# Patient Record
Sex: Female | Born: 1980
Health system: Southern US, Community
[De-identification: ages and names within clinical notes are randomized; demographics above are authoritative.]

## PROBLEM LIST (undated history)

## (undated) DIAGNOSIS — K859 Acute pancreatitis without necrosis or infection, unspecified: Secondary | ICD-10-CM

---

## 2003-09-17 HISTORY — PX: ABDOMINAL HERNIA REPAIR: SHX539

## 2003-09-17 HISTORY — PX: HERNIA REPAIR: SHX51

## 2014-11-30 ENCOUNTER — Inpatient Hospital Stay (HOSPITAL_COMMUNITY)
Admission: EM | Admit: 2014-11-30 | Discharge: 2014-12-03 | DRG: 419 | Disposition: A | Discharge status: Home / Self Care | Payer: 59 | Attending: Family Medicine | Admitting: Family Medicine

## 2014-11-30 ENCOUNTER — Encounter (HOSPITAL_COMMUNITY): Payer: Self-pay | Admitting: Family Medicine

## 2014-11-30 ENCOUNTER — Emergency Department (HOSPITAL_COMMUNITY): Payer: 59

## 2014-11-30 DIAGNOSIS — K859 Acute pancreatitis without necrosis or infection, unspecified: Secondary | ICD-10-CM

## 2014-11-30 DIAGNOSIS — K838 Other specified diseases of biliary tract: Secondary | ICD-10-CM

## 2014-11-30 DIAGNOSIS — K8020 Calculus of gallbladder without cholecystitis without obstruction: Secondary | ICD-10-CM

## 2014-11-30 DIAGNOSIS — R945 Abnormal results of liver function studies: Secondary | ICD-10-CM | POA: Diagnosis present

## 2014-11-30 DIAGNOSIS — K851 Biliary acute pancreatitis: Principal | ICD-10-CM | POA: Diagnosis present

## 2014-11-30 DIAGNOSIS — K5900 Constipation, unspecified: Secondary | ICD-10-CM | POA: Diagnosis present

## 2014-11-30 DIAGNOSIS — R112 Nausea with vomiting, unspecified: Secondary | ICD-10-CM

## 2014-11-30 DIAGNOSIS — R109 Unspecified abdominal pain: Secondary | ICD-10-CM | POA: Diagnosis present

## 2014-11-30 HISTORY — DX: Acute pancreatitis without necrosis or infection, unspecified: K85.90

## 2014-11-30 LAB — HEPATIC FUNCTION PANEL
ALT: 591 U/L — AB (ref 0–35)
AST: 913 U/L — ABNORMAL HIGH (ref 0–37)
Albumin: 4 g/dL (ref 3.5–5.2)
Alkaline Phosphatase: 128 U/L — ABNORMAL HIGH (ref 39–117)
BILIRUBIN DIRECT: 0.5 mg/dL (ref 0.0–0.5)
BILIRUBIN INDIRECT: 0.5 mg/dL (ref 0.3–0.9)
TOTAL PROTEIN: 7.6 g/dL (ref 6.0–8.3)
Total Bilirubin: 1 mg/dL (ref 0.3–1.2)

## 2014-11-30 LAB — CBC WITH DIFFERENTIAL/PLATELET
Basophils Absolute: 0 10*3/uL (ref 0.0–0.1)
Basophils Relative: 0 % (ref 0–1)
EOS ABS: 0.1 10*3/uL (ref 0.0–0.7)
EOS PCT: 1 % (ref 0–5)
HEMATOCRIT: 38 % (ref 36.0–46.0)
HEMOGLOBIN: 12.3 g/dL (ref 12.0–15.0)
LYMPHS PCT: 20 % (ref 12–46)
Lymphs Abs: 1.6 10*3/uL (ref 0.7–4.0)
MCH: 27.6 pg (ref 26.0–34.0)
MCHC: 32.4 g/dL (ref 30.0–36.0)
MCV: 85.2 fL (ref 78.0–100.0)
MONOS PCT: 8 % (ref 3–12)
Monocytes Absolute: 0.6 10*3/uL (ref 0.1–1.0)
Neutro Abs: 5.4 10*3/uL (ref 1.7–7.7)
Neutrophils Relative %: 71 % (ref 43–77)
Platelets: 294 10*3/uL (ref 150–400)
RBC: 4.46 MIL/uL (ref 3.87–5.11)
RDW: 13.4 % (ref 11.5–15.5)
WBC: 7.6 10*3/uL (ref 4.0–10.5)

## 2014-11-30 LAB — POC URINE PREG, ED: PREG TEST UR: NEGATIVE

## 2014-11-30 LAB — I-STAT CHEM 8, ED
BUN: 4 mg/dL — ABNORMAL LOW (ref 6–23)
CALCIUM ION: 1.18 mmol/L (ref 1.12–1.23)
Chloride: 102 mmol/L (ref 96–112)
Creatinine, Ser: 0.8 mg/dL (ref 0.50–1.10)
Glucose, Bld: 123 mg/dL — ABNORMAL HIGH (ref 70–99)
HEMATOCRIT: 44 % (ref 36.0–46.0)
Hemoglobin: 15 g/dL (ref 12.0–15.0)
Potassium: 4.6 mmol/L (ref 3.5–5.1)
Sodium: 139 mmol/L (ref 135–145)
TCO2: 22 mmol/L (ref 0–100)

## 2014-11-30 LAB — LIPASE, BLOOD: LIPASE: 338 U/L — AB (ref 11–59)

## 2014-11-30 MED ORDER — POLYETHYLENE GLYCOL 3350 17 G PO PACK: 17.0000 g | PACK | Freq: Every day | ORAL | Status: DC | PRN

## 2014-11-30 MED ORDER — KETOROLAC TROMETHAMINE 30 MG/ML IJ SOLN
30.0000 mg | Freq: Four times a day (QID) | INTRAMUSCULAR | Status: DC | PRN
  Administered 2014-12-02: 30 mg via INTRAVENOUS
  Filled 2014-11-30: qty 1

## 2014-11-30 MED ORDER — SODIUM CHLORIDE 0.45 % IV SOLN
INTRAVENOUS | Status: DC
  Administered 2014-11-30 – 2014-12-01 (×2): via INTRAVENOUS

## 2014-11-30 MED ORDER — IOHEXOL 300 MG/ML  SOLN
100.0000 mL | Freq: Once | INTRAMUSCULAR | Status: AC | PRN
  Administered 2014-11-30: 100 mL via INTRAVENOUS

## 2014-11-30 MED ORDER — IOHEXOL 300 MG/ML  SOLN
25.0000 mL | Freq: Once | INTRAMUSCULAR | Status: AC | PRN
  Administered 2014-11-30: 25 mL via ORAL

## 2014-11-30 MED ORDER — NORETHIN-ETH ESTRAD-FE BIPHAS 1 MG-10 MCG / 10 MCG PO TABS: 1.0000 | ORAL_TABLET | Freq: Every day | ORAL | Status: DC

## 2014-11-30 MED ORDER — GI COCKTAIL ~~LOC~~
30.0000 mL | Freq: Once | ORAL | Status: AC
  Administered 2014-11-30: 30 mL via ORAL
  Filled 2014-11-30: qty 30

## 2014-11-30 MED ORDER — ACETAMINOPHEN 650 MG RE SUPP: 650.0000 mg | Freq: Four times a day (QID) | RECTAL | Status: DC | PRN

## 2014-11-30 MED ORDER — ONDANSETRON HCL 4 MG/2ML IJ SOLN
4.0000 mg | Freq: Four times a day (QID) | INTRAMUSCULAR | Status: DC | PRN
  Administered 2014-12-02 – 2014-12-03 (×2): 4 mg via INTRAVENOUS
  Filled 2014-11-30 (×2): qty 2

## 2014-11-30 MED ORDER — ONDANSETRON 4 MG PO TBDP
4.0000 mg | ORAL_TABLET | Freq: Three times a day (TID) | ORAL | Status: DC | PRN
  Filled 2014-11-30: qty 1

## 2014-11-30 MED ORDER — ACETAMINOPHEN 325 MG PO TABS
650.0000 mg | ORAL_TABLET | Freq: Four times a day (QID) | ORAL | Status: DC | PRN
  Administered 2014-12-01: 650 mg via ORAL
  Filled 2014-11-30: qty 2

## 2014-11-30 MED ORDER — ONDANSETRON 4 MG PO TBDP
4.0000 mg | ORAL_TABLET | Freq: Once | ORAL | Status: AC
  Administered 2014-11-30: 4 mg via ORAL
  Filled 2014-11-30: qty 1

## 2014-11-30 MED ORDER — ENOXAPARIN SODIUM 40 MG/0.4ML ~~LOC~~ SOLN
40.0000 mg | SUBCUTANEOUS | Status: DC
  Administered 2014-12-01 – 2014-12-03 (×2): 40 mg via SUBCUTANEOUS
  Filled 2014-11-30 (×3): qty 0.4

## 2014-11-30 MED ORDER — KETOROLAC TROMETHAMINE 60 MG/2ML IM SOLN
30.0000 mg | Freq: Four times a day (QID) | INTRAMUSCULAR | Status: DC | PRN
Start: 2014-11-30 — End: 2014-11-30
  Filled 2014-11-30: qty 2

## 2014-11-30 MED ORDER — KETOROLAC TROMETHAMINE 60 MG/2ML IM SOLN
60.0000 mg | Freq: Once | INTRAMUSCULAR | Status: AC
Start: 2014-11-30 — End: 2014-11-30
  Administered 2014-11-30: 60 mg via INTRAMUSCULAR
  Filled 2014-11-30: qty 2

## 2014-11-30 NOTE — ED Notes (Signed)
CT notified pt IV is placed and she is ready for CT.

## 2014-11-30 NOTE — ED Notes (Signed)
Pt

## 2014-11-30 NOTE — ED Provider Notes (Signed)
CSN: [id number]     Arrival date & time 11/30/14  1243 History   First MD Initiated Contact with Patient 11/30/14 1553     Chief Complaint  Patient presents with  . Abdominal Pain     (Consider location/radiation/quality/duration/timing/severity/associated sxs/prior Treatment) HPI  Sylvia Salinas is a 34 y.o. female with PMH of hernia repair May 2015, cesarean presenting with on month history of daily abdominal pain which is epigastric and mid abdomen. Pt states this is where she had her hernia repair. Patient had pain yesterday and resolved after  Pepto-Bismol. Pain recurred this morning and has not resolved after pepto-bismol. Pain is worse with movement. Not worse with eating. She is unable to describe the pain. She denies any fevers, chills, nausea, vomiting. No stool changes. She is concerned that she worked herself at the gym yesterday. She is not taken anything other than Pepto-Bismol for her pain. She denies any urinary symptoms or pelvic complaints.  History reviewed. No pertinent past medical history. Past Surgical History  Procedure Laterality Date  . Hernia repair    . Cesarean section     History reviewed. No pertinent family history. History  Substance Use Topics  . Smoking status: Never Smoker   . Smokeless tobacco: Not on file  . Alcohol Use: No   OB History    No data available     Review of Systems 10 Systems reviewed and are negative for acute change except as noted in the HPI.    Allergies  Review of patient's allergies indicates no known allergies.  Home Medications   Prior to Admission medications   Medication Sig Start Date End Date Taking? Authorizing Provider  acetaminophen (TYLENOL) 500 MG tablet Take 500 mg by mouth every 8 (eight) hours as needed (pain).   Yes Historical Provider, MD  bismuth subsalicylate (PEPTO BISMOL) 262 MG/15ML suspension Take 30 mLs by mouth 3 (three) times daily as needed (stomach pain).   Yes Historical Provider, MD   Norethindrone-Ethinyl Estradiol-Fe Biphas (LO LOESTRIN FE) 1 MG-10 MCG / 10 MCG tablet Take 1 tablet by mouth at bedtime.   Yes Historical Provider, MD   BP 124/79 mmHg  Pulse 77  Temp(Src) 98.2 F (36.8 C) (Oral)  Resp 16  Ht 5' 4.02" (1.626 m)  Wt 193 lb 3.2 oz (87.635 kg)  BMI 33.15 kg/m2  SpO2 100%  LMP 11/24/2014 Physical Exam  Constitutional: She appears well-developed and well-nourished. No distress.  HENT:  Head: Normocephalic and atraumatic.  Mouth/Throat: Oropharynx is clear and moist.  Eyes: Conjunctivae and EOM are normal. Right eye exhibits no discharge. Left eye exhibits no discharge.  Cardiovascular: Normal rate and regular rhythm.   Pulmonary/Chest: Effort normal and breath sounds normal. No respiratory distress. She has no wheezes.  Abdominal: Soft. Bowel sounds are normal. She exhibits no distension.  Mild epigastric and mid abdomen tenderness without rebound, rigidity, guarding. No palpable hernia  Neurological: She is alert. She exhibits normal muscle tone. Coordination normal.  Skin: Skin is warm and dry. She is not diaphoretic.  Nursing note and vitals reviewed.   ED Course  Procedures (including critical care time) Labs Review Labs Reviewed  HEPATIC FUNCTION PANEL - Abnormal; Notable for the following:    AST 913 (*)    ALT 591 (*)    Alkaline Phosphatase 128 (*)    All other components within normal limits  LIPASE, BLOOD - Abnormal; Notable for the following:    Lipase 338 (*)    All other components  within normal limits  I-STAT CHEM 8, ED - Abnormal; Notable for the following:    BUN 4 (*)    Glucose, Bld 123 (*)    All other components within normal limits  CBC WITH DIFFERENTIAL/PLATELET  POC URINE PREG, ED    Imaging Review Ct Abdomen Pelvis W Contrast  11/30/2014   CLINICAL DATA:  Acute onset of abdominal pain.  Initial encounter.  EXAM: CT ABDOMEN AND PELVIS WITH CONTRAST  TECHNIQUE: Multidetector CT imaging of the abdomen and pelvis was  performed using the standard protocol following bolus administration of intravenous contrast.  CONTRAST:  100mL OMNIPAQUE IOHEXOL 300 MG/ML  SOLN  COMPARISON:  None.  FINDINGS: The visualized lung bases are clear.  There is mild dilatation of the intrahepatic biliary ducts, with dilatation of the common bile duct to 1.0 cm. This raises concern for distal obstruction, though no distal obstructing stone is seen. The liver and spleen are otherwise unremarkable. Multiple stones are seen within the gallbladder. The gallbladder is otherwise unremarkable. The pancreas and adrenal glands are within normal limits.  The kidneys are unremarkable in appearance. There is no evidence of hydronephrosis. No renal or ureteral stones are seen. No perinephric stranding is appreciated.  No free fluid is identified. The small bowel is unremarkable in appearance. The stomach is within normal limits. No acute vascular abnormalities are seen. An apparent small anterior abdominal wall mesh is noted at the umbilicus. The mesh is angled posteriorly at the left lower corner, of uncertain significance.  The appendix is normal in caliber and contains air, without evidence for appendicitis. Contrast progresses to the level of the proximal descending colon. The colon is unremarkable in appearance.  The bladder is mildly distended and grossly unremarkable. The uterus is grossly unremarkable in appearance. A 3.7 cm right adnexal cystic lesion is likely physiologic, though pelvic ultrasound could be considered if deemed clinically appropriate. The left ovary is unremarkable in appearance. No inguinal lymphadenopathy is seen.  No acute osseous abnormalities are identified. Prominent heterotopic bone formation along the left inferior pubic ramus may reflect remote injury.  IMPRESSION: 1. Small abdominal wall mesh at the umbilicus is angled posteriorly at the left lower corner, of uncertain significance. No evidence of recurrence of periumbilical  hernia. If the patient's symptoms persist, there is a chance that the posteriorly angled corner of the mesh is pushing against intra-abdominal structures, though no significant soft tissue inflammation is seen at this time. 2. Mild dilatation of the intrahepatic biliary ducts, with dilatation of the common bile duct to 1.0 cm. This raises concern for distal obstruction, though no distal obstructing stone is seen. Would correlate with LFTs and symptoms, and consider MRCP or ERCP for further evaluation.   Electronically Signed   By: Roanna RaiderJeffery  Chang M.D.   On: 11/30/2014 19:56     EKG Interpretation None      MDM   Final diagnoses:  Acute pancreatitis, unspecified pancreatitis type  Common bile duct dilation  Nausea and vomiting, vomiting of unspecified type   Patient presenting with epigastric abdominal pain and midline abdominal discomfort or she had her hernia but abdomen is soft without any evidence of peritonitis. Will give GI cocktail and check basic labs and reassess.  4:55 PM Pt states pain is severe after abdomen exam. Reassessing pt her pain is severe 8/10. Will obtain hepatic function tests and lipase as well as CT scan to further evaluate her severe pain.   6:13 PM Pt reported pain score of 1 after  toradol which increased to 3 after drinking contrast. Repeat abdominal exam with minimal tenderness in epigastric. nonsurgical abdomen.  6:26 PM Patient with a lipase of 338 as well as AST 900 and a LT 590 as well as elevated alkaline phosphatase 128. Patient currently reporting pain of 3 as well. These findings were discussed with patient. Pt refusing pain medications.  CT exam resulted at 7:56 with evidence of common bile duct dilation 1 cm as well as mild dilatation of intrahepatic biliary ducts. No structural extent visualized. Likely due to pancreatitis from obstructing stone in common bile duct. Otherwise appendix looks normal and abdominal mesh appears to have angled posteriorly.  Unclear significance. I doubt related to symptoms and this as well as other findings of CT was discussed with patient. Discussed plan for admission.  8:39 PM Spoke with family medicine with Dr. Dolores Hoose who agrees the evaluate the patient with plan for admission to MedSurg under Dr. Donnetta Hail service.  Discussed all results and patient verbalizes understanding and agrees with plan.  This is a shared patient. This patient was discussed with the physician who saw and evaluated the patient and agrees with the plan.     Oswaldo Conroy, PA-C 11/30/14 2128  Mancel Bale, MD 12/01/14 [phone]

## 2014-11-30 NOTE — ED Notes (Signed)
Phlebotomy at bedside.

## 2014-11-30 NOTE — ED Notes (Signed)
Report called to floor

## 2014-11-30 NOTE — ED Provider Notes (Signed)
  Face-to-face evaluation   History: She combines with intermittent upper abdominal pain, for 2 months. She describes the pain as severe, 8/10. Feels like it was aggravated by going to the gym yesterday. He was able to eat today. She's not had any vomiting. She typically takes Pepto-Bismol for the discomfort. She took some today. We did not give relief. She states that she has had a mesh hernia repair in her upper abdomen.  Physical exam: Alert, calm, cooperative. Heart regular in rhythm. No murmur. Lungs clear. Abdomen hypoactive bowel sounds, soft, mild mid upper abdominal tenderness without palpable hernia or mass.  Medical screening examination/treatment/procedure(s) were conducted as a shared visit with non-physician practitioner(s) and myself.  I personally evaluated the patient during the encounter  Mancel BaleElliott Tane Biegler, MD 12/01/14 [phone]

## 2014-11-30 NOTE — ED Notes (Signed)
Per pt sts abd pain where she had her hernia surgery. sts she was at the gym yesterday and may have overworked herself.

## 2014-11-30 NOTE — H&P (Signed)
[facility] Admission History and Physical Service Pager: [phone]  Patient name: Sylvia Salinas Medical record number: [id number] Date of birth: 07/24/1981 Age: 34 y.o. Gender: female  Primary Care Provider: No PCP Per Patient Consultants: none Code Status: full  Chief Complaint: abdominal pain  Assessment and Plan: Sylvia Salinas is a 34 y.o. female presenting with 1 month of postprandial epigastric pain and 2 days of worsening abdominal pain and nausea. PMH is significant for umbilical hernia status post mesh repair.  # Abdominal pain/Pancreatitis/CBD dilation: Likely gall stone pancreatitis though unclear if obstructing stone may still be present. CT showed CBD dilation of 1cm with multiple stone within gall bladder but no obstructing stone visualized. Gall bladder and pancreas both without signs of acute inflammation. Lipase 338, AST 913, ALT 591, Alk phos 128. - NPO except sips and chips, likely ADAT starting tomorrow given pain already improving - MIVF: 1/2 NS @ 146m/hr - pain control with toradol and zofran prn nausea, can add morphine if needed but so far toradol has been adequate - Repeat CMP in am - Call GI in am for ERCP vs MRCP  FEN/GI: NPO, MIVF Prophylaxis: lovenox  Disposition: Admit to FPTS, Dr. NNori Riisattending  History of Present Illness: Sylvia Salinas a 34y.o. female presenting with 1 month of abdominal pain which has been mild and relieved by pepto bismol. Now with 2 days of more severe epigastric pain and tenderness not relieved by pepto and accompanied by some nausea and constipation. Pain is worse postprandially. Pain feels similar to pain from her hernia so she thought it might be coming back and did not seek care until the pepto stopped relieving her pain.  Review Of Systems: Per HPI with the following additions: No fever, vomiting Otherwise 12 point review of systems was performed and was unremarkable.  Patient  Active Problem List   Diagnosis Date Noted  . Pancreatitis 11/30/2014   Past Medical History: History reviewed. No pertinent past medical history. Past Surgical History: Past Surgical History  Procedure Laterality Date  . Hernia repair    . Cesarean section     Social History: History  Substance Use Topics  . Smoking status: Never Smoker   . Smokeless tobacco: Not on file  . Alcohol Use: No   Additional social history: Occasional alcohol use, no smoking or drug use Please also refer to relevant sections of EMR.  Family History: History reviewed. No pertinent family history. Allergies and Medications: No Known Allergies No current facility-administered medications on file prior to encounter.   No current outpatient prescriptions on file prior to encounter.    Objective: BP 121/95 mmHg  Pulse 94  Temp(Src) 98 F (36.7 C) (Oral)  Resp 16  Ht 5' 4"  (1.626 m)  Wt 198 lb (89.812 kg)  BMI 33.97 kg/m2  SpO2 100%  LMP 11/24/2014 Exam: General:  Well woman, sitting up in bed, NAD, pleasant HEENT: MMM, NCAT, PERRL, EOMI Cardiovascular: RRR, no MRG, 2+ dp pulses Respiratory: CTAB, normal WOB Abdomen: soft, NTND, normoactive BS (reportedly with epigastric tenderness prior to meds) Extremities: WWP, no LE edema Skin: intact, no rashes Neuro: alert and oriented, no focal deficits  Labs and Imaging: Results for orders placed or performed during the hospital encounter of 11/30/14 (from the past 48 hour(s))  CBC with Differential     Status: None   Collection Time: 11/30/14  4:38 PM  Result Value Ref Range   WBC 7.6 4.0 - 10.5 K/uL   RBC  4.46 3.87 - 5.11 MIL/uL   Hemoglobin 12.3 12.0 - 15.0 g/dL   HCT 38.0 36.0 - 46.0 %   MCV 85.2 78.0 - 100.0 fL   MCH 27.6 26.0 - 34.0 pg   MCHC 32.4 30.0 - 36.0 g/dL   RDW 13.4 11.5 - 15.5 %   Platelets 294 150 - 400 K/uL   Neutrophils Relative % 71 43 - 77 %   Neutro Abs 5.4 1.7 - 7.7 K/uL   Lymphocytes Relative 20 12 - 46 %    Lymphs Abs 1.6 0.7 - 4.0 K/uL   Monocytes Relative 8 3 - 12 %   Monocytes Absolute 0.6 0.1 - 1.0 K/uL   Eosinophils Relative 1 0 - 5 %   Eosinophils Absolute 0.1 0.0 - 0.7 K/uL   Basophils Relative 0 0 - 1 %   Basophils Absolute 0.0 0.0 - 0.1 K/uL  Hepatic function panel     Status: Abnormal   Collection Time: 11/30/14  4:38 PM  Result Value Ref Range   Total Protein 7.6 6.0 - 8.3 g/dL   Albumin 4.0 3.5 - 5.2 g/dL   AST 913 (H) 0 - 37 U/L   ALT 591 (H) 0 - 35 U/L   Alkaline Phosphatase 128 (H) 39 - 117 U/L   Total Bilirubin 1.0 0.3 - 1.2 mg/dL   Bilirubin, Direct 0.5 0.0 - 0.5 mg/dL   Indirect Bilirubin 0.5 0.3 - 0.9 mg/dL  Lipase, blood     Status: Abnormal   Collection Time: 11/30/14  4:38 PM  Result Value Ref Range   Lipase 338 (H) 11 - 59 U/L  I-Stat Chem 8, ED     Status: Abnormal   Collection Time: 11/30/14  4:43 PM  Result Value Ref Range   Sodium 139 135 - 145 mmol/L   Potassium 4.6 3.5 - 5.1 mmol/L   Chloride 102 96 - 112 mmol/L   BUN 4 (L) 6 - 23 mg/dL   Creatinine, Ser 0.80 0.50 - 1.10 mg/dL   Glucose, Bld 123 (H) 70 - 99 mg/dL   Calcium, Ion 1.18 1.12 - 1.23 mmol/L   TCO2 22 0 - 100 mmol/L   Hemoglobin 15.0 12.0 - 15.0 g/dL   HCT 44.0 36.0 - 46.0 %  POC Urine Pregnancy, ED (do NOT order at [facility])     Status: None   Collection Time: 11/30/14  4:52 PM  Result Value Ref Range   Preg Test, Ur NEGATIVE NEGATIVE    Comment:        THE SENSITIVITY OF THIS METHODOLOGY IS >24 mIU/mL    CT abdomen: 1. Small abdominal wall mesh at the umbilicus is angled posteriorly at the left lower corner, of uncertain significance. No evidence of recurrence of periumbilical hernia. If the patient's symptoms persist, there is a chance that the posteriorly angled corner of the mesh is pushing against intra-abdominal structures, though no significant soft tissue inflammation is seen at this time. 2. Mild dilatation of the intrahepatic biliary ducts, with dilatation of the common bile duct  to 1.0 cm. This raises concern for distal obstruction, though no distal obstructing stone is seen. Would correlate with LFTs and symptoms, and consider MRCP or ERCP for further evaluation.   Frazier Richards, MD 11/30/2014, 9:08 PM PGY-2, [place] [facility] pager: [phone], text pages welcome

## 2014-11-30 NOTE — ED Notes (Signed)
CT informed pt is finished with contrast.

## 2014-11-30 NOTE — ED Notes (Signed)
Tori, PA informed of lab results.

## 2014-12-01 ENCOUNTER — Encounter (HOSPITAL_COMMUNITY): Payer: Self-pay | Admitting: General Surgery

## 2014-12-01 ENCOUNTER — Inpatient Hospital Stay (HOSPITAL_COMMUNITY): Payer: 59

## 2014-12-01 DIAGNOSIS — R112 Nausea with vomiting, unspecified: Secondary | ICD-10-CM | POA: Insufficient documentation

## 2014-12-01 DIAGNOSIS — R932 Abnormal findings on diagnostic imaging of liver and biliary tract: Secondary | ICD-10-CM

## 2014-12-01 DIAGNOSIS — K838 Other specified diseases of biliary tract: Secondary | ICD-10-CM | POA: Insufficient documentation

## 2014-12-01 DIAGNOSIS — K8020 Calculus of gallbladder without cholecystitis without obstruction: Secondary | ICD-10-CM

## 2014-12-01 DIAGNOSIS — K859 Acute pancreatitis without necrosis or infection, unspecified: Secondary | ICD-10-CM | POA: Insufficient documentation

## 2014-12-01 DIAGNOSIS — R7989 Other specified abnormal findings of blood chemistry: Secondary | ICD-10-CM

## 2014-12-01 LAB — CBC
HCT: 36.3 % (ref 36.0–46.0)
HEMOGLOBIN: 11.6 g/dL — AB (ref 12.0–15.0)
MCH: 27.4 pg (ref 26.0–34.0)
MCHC: 32 g/dL (ref 30.0–36.0)
MCV: 85.8 fL (ref 78.0–100.0)
Platelets: 273 10*3/uL (ref 150–400)
RBC: 4.23 MIL/uL (ref 3.87–5.11)
RDW: 13.6 % (ref 11.5–15.5)
WBC: 5.6 10*3/uL (ref 4.0–10.5)

## 2014-12-01 LAB — COMPREHENSIVE METABOLIC PANEL
ALK PHOS: 132 U/L — AB (ref 39–117)
ALT: 1419 U/L — AB (ref 0–35)
AST: 1972 U/L — ABNORMAL HIGH (ref 0–37)
Albumin: 3.3 g/dL — ABNORMAL LOW (ref 3.5–5.2)
Anion gap: 11 (ref 5–15)
BILIRUBIN TOTAL: 2.6 mg/dL — AB (ref 0.3–1.2)
BUN: 5 mg/dL — ABNORMAL LOW (ref 6–23)
CALCIUM: 8.9 mg/dL (ref 8.4–10.5)
CO2: 20 mmol/L (ref 19–32)
CREATININE: 0.83 mg/dL (ref 0.50–1.10)
Chloride: 105 mmol/L (ref 96–112)
GFR calc Af Amer: >90 mL/min (ref >90)
GFR calc non Af Amer: >90 mL/min (ref >90)
Glucose, Bld: 95 mg/dL (ref 70–99)
Potassium: 3.8 mmol/L (ref 3.5–5.1)
Sodium: 136 mmol/L (ref 135–145)
Total Protein: 7 g/dL (ref 6.0–8.3)

## 2014-12-01 LAB — HEPATITIS PANEL, ACUTE
HCV Ab: NEGATIVE
HEP B S AG: NEGATIVE
Hep A IgM: NONREACTIVE
Hep B C IgM: NONREACTIVE

## 2014-12-01 LAB — SURGICAL PCR SCREEN
MRSA, PCR: POSITIVE — AB
Staphylococcus aureus: POSITIVE — AB

## 2014-12-01 LAB — LIPASE, BLOOD: LIPASE: 40 U/L (ref 11–59)

## 2014-12-01 MED ORDER — GADOBENATE DIMEGLUMINE 529 MG/ML IV SOLN
20.0000 mL | Freq: Once | INTRAVENOUS | Status: AC
  Administered 2014-12-01: 20 mL via INTRAVENOUS

## 2014-12-01 MED ORDER — DEXTROSE-NACL 5-0.45 % IV SOLN
INTRAVENOUS | Status: DC
  Administered 2014-12-01 – 2014-12-03 (×5): via INTRAVENOUS

## 2014-12-01 MED ORDER — CETYLPYRIDINIUM CHLORIDE 0.05 % MT LIQD: 7.0000 mL | Freq: Two times a day (BID) | OROMUCOSAL | Status: DC

## 2014-12-01 MED ORDER — CHLORHEXIDINE GLUCONATE CLOTH 2 % EX PADS
6.0000 | MEDICATED_PAD | Freq: Every day | CUTANEOUS | Status: DC
Start: 2014-12-02 — End: 2014-12-03
  Administered 2014-12-02 – 2014-12-03 (×2): 6 via TOPICAL

## 2014-12-01 MED ORDER — CEFTRIAXONE SODIUM IN DEXTROSE 40 MG/ML IV SOLN
2.0000 g | INTRAVENOUS | Status: DC
  Administered 2014-12-02: 2 g via INTRAVENOUS
  Filled 2014-12-01: qty 50

## 2014-12-01 MED ORDER — CHLORHEXIDINE GLUCONATE 0.12 % MT SOLN
15.0000 mL | Freq: Two times a day (BID) | OROMUCOSAL | Status: DC
Start: 2014-12-01 — End: 2014-12-02
  Administered 2014-12-01 (×2): 15 mL via OROMUCOSAL
  Filled 2014-12-01 (×2): qty 15

## 2014-12-01 MED ORDER — MUPIROCIN 2 % EX OINT
1.0000 "application " | TOPICAL_OINTMENT | Freq: Two times a day (BID) | CUTANEOUS | Status: DC
  Administered 2014-12-01 – 2014-12-03 (×4): 1 via NASAL
  Filled 2014-12-01: qty 22

## 2014-12-01 NOTE — Progress Notes (Addendum)
GI ATTENDING  MRCP completed. NO biliary ductal dilation or choledocholithiasis. She does have pancreas divisum. Recommend proceeding to laparoscopic cholecystectomy. Results discussed with patient. Please call if we can be of further assistance. Thank you  Wilhemina BonitoJohn N. Eda KeysPerry, Jr., M.D. [facility] Division of Gastroenterology.

## 2014-12-01 NOTE — Consult Note (Signed)
Reason for Consult: biliary pancreatitis, choledocholithiasis Referring Physician: Dorcas Mcmurray   HPI: Sylvia Salinas is a healthy 34 year old female with a history of incisional hernia repair in 2005 presenting with abdominal pain.  The patient reports 2 days ago she developed epigastric abdominal.  She took pepto-bismol and her pain resolved 5 hours later.  It was mild at this point and she was able to work out.  Her pain returned shortly thereafter.  The patient reports such symptoms since January which typically resolved within 1/2 hour after taking bipmol.  She attributed this pain to her hernia, but denies any obstructive symptoms.  Location is epigastric, band like and radiates to the back.  Characterized as sharp, contraction like pain.  No aggravating or alleviating factors.  She denies fever, chills or sweats.  She complains of constipation and had an episode of nausea yesterday after a physical exam.  She denies recent weight loss, melena, hematochezia.  On admission, lipase was 338, no repeat today.  AST and ALT were 913 and 591 respectively with a normal bilirubin.  Today, bilirubin increased to 2.6 and AST and ALT increased to 1972 and 1419. She does not have a white count.  CT of abdomen and pelvis CBD 1cm, gallstones and gallstones.  She has been NPO and on IV fluids.  She denies any recent travel.  She denies IV drug use.  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     Past Medical History  Diagnosis Date  . Acute pancreatitis 11/30/2014    Past Surgical History  Procedure Laterality Date  . Cesarean section  04/2003; 01/2009  . Hernia repair  2005  . Abdominal hernia repair  2005    Family History  Problem Relation Age of Onset  . Stroke Mother   . Stroke Father   . Kidney cancer Neg Hx   . Alcoholism Neg Hx   . Liver cancer Maternal Uncle     Social History:  reports that she has never smoked. She has never used smokeless tobacco. She reports that she drinks  alcohol. She reports that she does not use illicit drugs.  Allergies: No Known Allergies  Medications:  Scheduled Meds: . antiseptic oral rinse  7 mL Mouth Rinse q12n4p  . chlorhexidine  15 mL Mouth Rinse BID  . enoxaparin (LOVENOX) injection  40 mg Subcutaneous Q24H  . Norethindrone-Ethinyl Estradiol-Fe Biphas  1 tablet Oral QHS   Continuous Infusions: . sodium chloride 125 mL/hr at 12/01/14 0538   PRN Meds:.acetaminophen **OR** acetaminophen, ketorolac, ondansetron (ZOFRAN) IV, polyethylene glycol   Results for orders placed or performed during the hospital encounter of 11/30/14 (from the past 48 hour(s))  CBC with Differential     Status: None   Collection Time: 11/30/14  4:38 PM  Result Value Ref Range   WBC 7.6 4.0 - 10.5 K/uL   RBC 4.46 3.87 - 5.11 MIL/uL   Hemoglobin 12.3 12.0 - 15.0 g/dL   HCT 38.0 36.0 - 46.0 %   MCV 85.2 78.0 - 100.0 fL   MCH 27.6 26.0 - 34.0 pg   MCHC 32.4 30.0 - 36.0 g/dL   RDW 13.4 11.5 - 15.5 %   Platelets 294 150 - 400 K/uL   Neutrophils Relative % 71 43 - 77 %   Neutro Abs 5.4 1.7 - 7.7 K/uL   Lymphocytes Relative 20 12 - 46 %   Lymphs Abs 1.6 0.7 - 4.0 K/uL   Monocytes Relative 8 3 - 12 %  Monocytes Absolute 0.6 0.1 - 1.0 K/uL   Eosinophils Relative 1 0 - 5 %   Eosinophils Absolute 0.1 0.0 - 0.7 K/uL   Basophils Relative 0 0 - 1 %   Basophils Absolute 0.0 0.0 - 0.1 K/uL  Hepatic function panel     Status: Abnormal   Collection Time: 11/30/14  4:38 PM  Result Value Ref Range   Total Protein 7.6 6.0 - 8.3 g/dL   Albumin 4.0 3.5 - 5.2 g/dL   AST 913 (H) 0 - 37 U/L   ALT 591 (H) 0 - 35 U/L   Alkaline Phosphatase 128 (H) 39 - 117 U/L   Total Bilirubin 1.0 0.3 - 1.2 mg/dL   Bilirubin, Direct 0.5 0.0 - 0.5 mg/dL   Indirect Bilirubin 0.5 0.3 - 0.9 mg/dL  Lipase, blood     Status: Abnormal   Collection Time: 11/30/14  4:38 PM  Result Value Ref Range   Lipase 338 (H) 11 - 59 U/L  I-Stat Chem 8, ED     Status: Abnormal   Collection Time:  11/30/14  4:43 PM  Result Value Ref Range   Sodium 139 135 - 145 mmol/L   Potassium 4.6 3.5 - 5.1 mmol/L   Chloride 102 96 - 112 mmol/L   BUN 4 (L) 6 - 23 mg/dL   Creatinine, Ser 0.80 0.50 - 1.10 mg/dL   Glucose, Bld 123 (H) 70 - 99 mg/dL   Calcium, Ion 1.18 1.12 - 1.23 mmol/L   TCO2 22 0 - 100 mmol/L   Hemoglobin 15.0 12.0 - 15.0 g/dL   HCT 44.0 36.0 - 46.0 %  POC Urine Pregnancy, ED (do NOT order at [facility])     Status: None   Collection Time: 11/30/14  4:52 PM  Result Value Ref Range   Preg Test, Ur NEGATIVE NEGATIVE    Comment:        THE SENSITIVITY OF THIS METHODOLOGY IS >24 mIU/mL   Comprehensive metabolic panel     Status: Abnormal   Collection Time: 12/01/14  6:12 AM  Result Value Ref Range   Sodium 136 135 - 145 mmol/L   Potassium 3.8 3.5 - 5.1 mmol/L    Comment: DELTA CHECK NOTED   Chloride 105 96 - 112 mmol/L   CO2 20 19 - 32 mmol/L   Glucose, Bld 95 70 - 99 mg/dL   BUN 5 (L) 6 - 23 mg/dL   Creatinine, Ser 0.83 0.50 - 1.10 mg/dL   Calcium 8.9 8.4 - 10.5 mg/dL   Total Protein 7.0 6.0 - 8.3 g/dL   Albumin 3.3 (L) 3.5 - 5.2 g/dL   AST 1972 (H) 0 - 37 U/L   ALT 1419 (H) 0 - 35 U/L   Alkaline Phosphatase 132 (H) 39 - 117 U/L   Total Bilirubin 2.6 (H) 0.3 - 1.2 mg/dL   GFR calc non Af Amer >90 >90 mL/min   GFR calc Af Amer >90 >90 mL/min    Comment: (NOTE) The eGFR has been calculated using the CKD EPI equation. This calculation has not been validated in all clinical situations. eGFR's persistently <90 mL/min signify possible Chronic Kidney Disease.    Anion gap 11 5 - 15    Ct Abdomen Pelvis W Contrast  11/30/2014   CLINICAL DATA:  Acute onset of abdominal pain.  Initial encounter.  EXAM: CT ABDOMEN AND PELVIS WITH CONTRAST  TECHNIQUE: Multidetector CT imaging of the abdomen and pelvis was performed using the standard protocol following bolus administration  of intravenous contrast.  CONTRAST:  169m OMNIPAQUE IOHEXOL 300 MG/ML  SOLN  COMPARISON:  None.  FINDINGS:  The visualized lung bases are clear.  There is mild dilatation of the intrahepatic biliary ducts, with dilatation of the common bile duct to 1.0 cm. This raises concern for distal obstruction, though no distal obstructing stone is seen. The liver and spleen are otherwise unremarkable. Multiple stones are seen within the gallbladder. The gallbladder is otherwise unremarkable. The pancreas and adrenal glands are within normal limits.  The kidneys are unremarkable in appearance. There is no evidence of hydronephrosis. No renal or ureteral stones are seen. No perinephric stranding is appreciated.  No free fluid is identified. The small bowel is unremarkable in appearance. The stomach is within normal limits. No acute vascular abnormalities are seen. An apparent small anterior abdominal wall mesh is noted at the umbilicus. The mesh is angled posteriorly at the left lower corner, of uncertain significance.  The appendix is normal in caliber and contains air, without evidence for appendicitis. Contrast progresses to the level of the proximal descending colon. The colon is unremarkable in appearance.  The bladder is mildly distended and grossly unremarkable. The uterus is grossly unremarkable in appearance. A 3.7 cm right adnexal cystic lesion is likely physiologic, though pelvic ultrasound could be considered if deemed clinically appropriate. The left ovary is unremarkable in appearance. No inguinal lymphadenopathy is seen.  No acute osseous abnormalities are identified. Prominent heterotopic bone formation along the left inferior pubic ramus may reflect remote injury.  IMPRESSION: 1. Small abdominal wall mesh at the umbilicus is angled posteriorly at the left lower corner, of uncertain significance. No evidence of recurrence of periumbilical hernia. If the patient's symptoms persist, there is a chance that the posteriorly angled corner of the mesh is pushing against intra-abdominal structures, though no significant soft  tissue inflammation is seen at this time. 2. Mild dilatation of the intrahepatic biliary ducts, with dilatation of the common bile duct to 1.0 cm. This raises concern for distal obstruction, though no distal obstructing stone is seen. Would correlate with LFTs and symptoms, and consider MRCP or ERCP for further evaluation.   Electronically Signed   By: JGarald BaldingM.D.   On: 11/30/2014 19:56    Review of Systems  All other systems reviewed and are negative.  Blood pressure 122/68, pulse 81, temperature 98.4 F (36.9 C), temperature source Oral, resp. rate 16, height 5' 4.02" (1.626 m), weight 87.635 kg (193 lb 3.2 oz), last menstrual period 11/24/2014, SpO2 99 %. Physical Exam  Constitutional: She is oriented to person, place, and time. She appears well-developed and well-nourished. No distress.  HENT:  Head: Normocephalic and atraumatic.  Eyes: Right eye exhibits no discharge. Left eye exhibits no discharge. No scleral icterus.  Neck: Normal range of motion. Neck supple.  Cardiovascular: Normal rate, regular rhythm, normal heart sounds and intact distal pulses.  Exam reveals no gallop and no friction rub.   No murmur heard. Respiratory: Effort normal and breath sounds normal. No respiratory distress. She has no wheezes. She has no rales. She exhibits no tenderness.  GI: Soft. Bowel sounds are normal. She exhibits no distension and no mass. There is no tenderness. There is no rebound and no guarding.  Minimally tender to LUQ  Musculoskeletal: Normal range of motion. She exhibits no edema or tenderness.  Neurological: She is alert and oriented to person, place, and time.  Skin: Skin is warm and dry. No rash noted. She is not diaphoretic.  No erythema. No pallor.  Psychiatric: She has a normal mood and affect. Her behavior is normal. Judgment and thought content normal.    Assessment/Plan: Cholelithiasis Biliary pancreatitis  Possible choledocholithiasis Abnormal LFTs  MRCP v ERCP, GI  to evaluate the patient.  Will order a hepatitis panel given the marked elevated in LFTs, although I suspect it will be negative.  Repeat Lipase.  Agree with bowel rest, IVF, pain control.   Will proceed with a cholecystectomy following GI work up.    RIEBOCK, EMINA ANP-BC 12/01/2014, 10:06 AM

## 2014-12-01 NOTE — Progress Notes (Signed)
Patient ID: Sylvia Salinas, female   DOB: 1981-09-11, 34 y.o.   MRN: [id number]  Plan to proceed with lap chole and IOC tomorrow. I discussed the procedure in detail.  We discussed the risks and benefits of a laparoscopic cholecystectomy and possible cholangiogram including, but not limited to bleeding, infection, injury to surrounding structures such as the intestine or liver, bile leak, retained gallstones, need to convert to an open procedure, prolonged diarrhea, blood clots such as  DVT, common bile duct injury, anesthesia risks, and possible need for additional procedures.  The likelihood of improvement in symptoms and return to the patient's normal status is good. We discussed the typical post-operative recovery course.

## 2014-12-01 NOTE — Progress Notes (Signed)
Family Medicine Teaching Service Daily Progress Note Intern Pager: [phone]  Patient name: Sylvia Salinas Medical record number: [id number] Date of birth: 05-20-1981 Age: 34 y.o. Gender: female  Primary Care Provider: No PCP Per Patient Consultants: GI Code Status: Full  Pt Overview and Major Events to Date:  3/16: Admitted for abdominal pain and elevated LFTs  Assessment and Plan: Sylvia Salinas is a 34 y.o. female presenting with 1 month of postprandial epigastric pain and 2 days of worsening abdominal pain and nausea. PMH is significant for umbilical hernia status post mesh repair.  # Abdominal pain/Pancreatitis/CBD dilation: Likely gall stone pancreatitis though unclear if obstructing stone may still be present. CT showed CBD dilation of 1cm with multiple stone within gall bladder but no obstructing stone visualized. Gall bladder and pancreas both without signs of acute inflammation. +Murphy sign on exam. Lipase 338. AST 913, ALT 591, Alk phos 128 on admission. -Continue to monitor -GI consulted; appreciate recs -- patient likely needs ERCP - NPO except sips and chips - possible procedures today - MIVF: d5 1/2 NS @ 163m/hr - pain control with toradol and zofran prn nausea - Repeat CMP this AM with doubling of enzymes -Will also consult surgery; appreciate recs  FEN/GI: NPO, MIVF Prophylaxis: lovenox  Disposition: Continue current management as above; pending further work-up.  Subjective:  Patient doing well this morning. She states that she has had no more abdominal pain since Toradol yesterday in the ED. She had an uneventful night. She denies excessive use of Tylenol. Denies exposure to hepatitis.   Objective: Temp:  [98 F (36.7 C)-98.9 F (37.2 C)] 98.4 F (36.9 C) (03/17 0900) Pulse Rate:  [77-94] 81 (03/17 0900) Resp:  [16-20] 16 (03/17 0900) BP: (121-154)/(56-97) 122/68 mmHg (03/17 0900) SpO2:  [99 %-100 %] 99 % (03/17 0900) Weight:  [193 lb 3.2 oz  (87.635 kg)-198 lb (89.812 kg)] 193 lb 3.2 oz (87.635 kg) (03/16 2116) Physical Exam: General: Well woman, up and alert in room, NAD, pleasant HEENT: MMM, NCAT, EOMI Cardiovascular: RRR, no MRG, 2+ dp pulses Respiratory: CTAB, normal WOB Abdomen: soft, TTP in epigastrium, +Murphy's sign, normoactive BS  Extremities: WWP, no LE edema Skin: intact, no rashes Neuro: alert and oriented, no focal deficits  Laboratory: Results for orders placed or performed during the hospital encounter of 11/30/14 (from the past 24 hour(s))  CBC with Differential     Status: None   Collection Time: 11/30/14  4:38 PM  Result Value Ref Range   WBC 7.6 4.0 - 10.5 K/uL   RBC 4.46 3.87 - 5.11 MIL/uL   Hemoglobin 12.3 12.0 - 15.0 g/dL   HCT 38.0 36.0 - 46.0 %   MCV 85.2 78.0 - 100.0 fL   MCH 27.6 26.0 - 34.0 pg   MCHC 32.4 30.0 - 36.0 g/dL   RDW 13.4 11.5 - 15.5 %   Platelets 294 150 - 400 K/uL   Neutrophils Relative % 71 43 - 77 %   Neutro Abs 5.4 1.7 - 7.7 K/uL   Lymphocytes Relative 20 12 - 46 %   Lymphs Abs 1.6 0.7 - 4.0 K/uL   Monocytes Relative 8 3 - 12 %   Monocytes Absolute 0.6 0.1 - 1.0 K/uL   Eosinophils Relative 1 0 - 5 %   Eosinophils Absolute 0.1 0.0 - 0.7 K/uL   Basophils Relative 0 0 - 1 %   Basophils Absolute 0.0 0.0 - 0.1 K/uL  Hepatic function panel     Status: Abnormal  Collection Time: 11/30/14  4:38 PM  Result Value Ref Range   Total Protein 7.6 6.0 - 8.3 g/dL   Albumin 4.0 3.5 - 5.2 g/dL   AST 913 (H) 0 - 37 U/L   ALT 591 (H) 0 - 35 U/L   Alkaline Phosphatase 128 (H) 39 - 117 U/L   Total Bilirubin 1.0 0.3 - 1.2 mg/dL   Bilirubin, Direct 0.5 0.0 - 0.5 mg/dL   Indirect Bilirubin 0.5 0.3 - 0.9 mg/dL  Lipase, blood     Status: Abnormal   Collection Time: 11/30/14  4:38 PM  Result Value Ref Range   Lipase 338 (H) 11 - 59 U/L  I-Stat Chem 8, ED     Status: Abnormal   Collection Time: 11/30/14  4:43 PM  Result Value Ref Range   Sodium 139 135 - 145 mmol/L   Potassium 4.6  3.5 - 5.1 mmol/L   Chloride 102 96 - 112 mmol/L   BUN 4 (L) 6 - 23 mg/dL   Creatinine, Ser 0.80 0.50 - 1.10 mg/dL   Glucose, Bld 123 (H) 70 - 99 mg/dL   Calcium, Ion 1.18 1.12 - 1.23 mmol/L   TCO2 22 0 - 100 mmol/L   Hemoglobin 15.0 12.0 - 15.0 g/dL   HCT 44.0 36.0 - 46.0 %  POC Urine Pregnancy, ED (do NOT order at [facility])     Status: None   Collection Time: 11/30/14  4:52 PM  Result Value Ref Range   Preg Test, Ur NEGATIVE NEGATIVE  Comprehensive metabolic panel     Status: Abnormal   Collection Time: 12/01/14  6:12 AM  Result Value Ref Range   Sodium 136 135 - 145 mmol/L   Potassium 3.8 3.5 - 5.1 mmol/L   Chloride 105 96 - 112 mmol/L   CO2 20 19 - 32 mmol/L   Glucose, Bld 95 70 - 99 mg/dL   BUN 5 (L) 6 - 23 mg/dL   Creatinine, Ser 0.83 0.50 - 1.10 mg/dL   Calcium 8.9 8.4 - 10.5 mg/dL   Total Protein 7.0 6.0 - 8.3 g/dL   Albumin 3.3 (L) 3.5 - 5.2 g/dL   AST 1972 (H) 0 - 37 U/L   ALT 1419 (H) 0 - 35 U/L   Alkaline Phosphatase 132 (H) 39 - 117 U/L   Total Bilirubin 2.6 (H) 0.3 - 1.2 mg/dL   GFR calc non Af Amer >90 >90 mL/min   GFR calc Af Amer >90 >90 mL/min   Anion gap 11 5 - 15    Imaging/Diagnostic Tests: Ct Abdomen Pelvis W Contrast  11/30/2014   CLINICAL DATA:  Acute onset of abdominal pain.  Initial encounter.  EXAM: CT ABDOMEN AND PELVIS WITH CONTRAST  TECHNIQUE: Multidetector CT imaging of the abdomen and pelvis was performed using the standard protocol following bolus administration of intravenous contrast.  CONTRAST:  158m OMNIPAQUE IOHEXOL 300 MG/ML  SOLN  COMPARISON:  None.  FINDINGS: The visualized lung bases are clear.  There is mild dilatation of the intrahepatic biliary ducts, with dilatation of the common bile duct to 1.0 cm. This raises concern for distal obstruction, though no distal obstructing stone is seen. The liver and spleen are otherwise unremarkable. Multiple stones are seen within the gallbladder. The gallbladder is otherwise unremarkable. The pancreas  and adrenal glands are within normal limits.  The kidneys are unremarkable in appearance. There is no evidence of hydronephrosis. No renal or ureteral stones are seen. No perinephric stranding is appreciated.  No free  fluid is identified. The small bowel is unremarkable in appearance. The stomach is within normal limits. No acute vascular abnormalities are seen. An apparent small anterior abdominal wall mesh is noted at the umbilicus. The mesh is angled posteriorly at the left lower corner, of uncertain significance.  The appendix is normal in caliber and contains air, without evidence for appendicitis. Contrast progresses to the level of the proximal descending colon. The colon is unremarkable in appearance.  The bladder is mildly distended and grossly unremarkable. The uterus is grossly unremarkable in appearance. A 3.7 cm right adnexal cystic lesion is likely physiologic, though pelvic ultrasound could be considered if deemed clinically appropriate. The left ovary is unremarkable in appearance. No inguinal lymphadenopathy is seen.  No acute osseous abnormalities are identified. Prominent heterotopic bone formation along the left inferior pubic ramus may reflect remote injury.  IMPRESSION: 1. Small abdominal wall mesh at the umbilicus is angled posteriorly at the left lower corner, of uncertain significance. No evidence of recurrence of periumbilical hernia. If the patient's symptoms persist, there is a chance that the posteriorly angled corner of the mesh is pushing against intra-abdominal structures, though no significant soft tissue inflammation is seen at this time. 2. Mild dilatation of the intrahepatic biliary ducts, with dilatation of the common bile duct to 1.0 cm. This raises concern for distal obstruction, though no distal obstructing stone is seen. Would correlate with LFTs and symptoms, and consider MRCP or ERCP for further evaluation.   Electronically Signed   By: Garald Balding M.D.   On: 11/30/2014  19:56    Katheren Shams, DO 12/01/2014, 10:43 AM PGY-1, [place] Intern pager: [phone], text pages welcome

## 2014-12-01 NOTE — Consult Note (Signed)
[facility] Consult: 11:27 AM 12/01/2014  LOS: 1 day    Referring Provider: Dr Jennette Kettle  Primary Care Physician:  No PCP Per Patient Primary Gastroenterologist:  Gentry Fitz.      Reason for Consultation:  Biliary pancreatitis, ? CBD stone.    HPI: Sylvia Salinas is a 34 y.o. female.  Generally healthy.  S/p 2005 umbilical hernia repair with mesh.  S/p  c-sections in 2004 and 2010.   For 2 months having events of epigastric pain, mostly post prandial, that resolve within 30 minutes with PRN bismuth.  Modified her diet avoiding fats, excessive carbs but still had sxs occurring about once per week.  She thought it might be a recurrence of the hernia. On Tuesday had pain but it was now radiating around to her back.  Took multiple doses of Bismuth and pain resolved after ~ 5 hours.  It was recurrent when she woke up on Wednesday, she went to work and it calmed down a bit, but recurred with increasing intensity trhat afternoon. She came to ED.  Nausea only occurred after physical exam.  She never vomited.  CT confirmed dilated CBC (1cm) and intrahepatics, gallstones, no obvious CBD stones/filling defects.  This morning her sxs are better, she is pain free and never received pain or nausea meds in ED or on floor. .      Past Medical History  Diagnosis Date  . Acute pancreatitis 11/30/2014    Past Surgical History  Procedure Laterality Date  . Cesarean section  04/2003; 01/2009  . Hernia repair  2005  . Abdominal hernia repair  2005    Prior to Admission medications   Medication Sig Start Date End Date Taking? Authorizing Provider  acetaminophen (TYLENOL) 500 MG tablet Take 500 mg by mouth every 8 (eight) hours as needed (pain).   Yes Historical Provider, MD  bismuth subsalicylate (PEPTO BISMOL) 262 MG/15ML  suspension Take 30 mLs by mouth 3 (three) times daily as needed (stomach pain).   Yes Historical Provider, MD  Norethindrone-Ethinyl Estradiol-Fe Biphas (LO LOESTRIN FE) 1 MG-10 MCG / 10 MCG tablet Take 1 tablet by mouth at bedtime.   Yes Historical Provider, MD    Scheduled Meds: . antiseptic oral rinse  7 mL Mouth Rinse q12n4p  . chlorhexidine  15 mL Mouth Rinse BID  . enoxaparin (LOVENOX) injection  40 mg Subcutaneous Q24H  . Norethindrone-Ethinyl Estradiol-Fe Biphas  1 tablet Oral QHS   Infusions: . dextrose 5 % and 0.45% NaCl 125 mL/hr at 12/01/14 1124   PRN Meds: acetaminophen **OR** acetaminophen, ketorolac, ondansetron (ZOFRAN) IV, polyethylene glycol   Allergies as of 11/30/2014  . (No Known Allergies)    Family History  Problem Relation Age of Onset  . Stroke Mother   . Stroke Father   . Kidney cancer Neg Hx   . Alcoholism Neg Hx   . Liver cancer Maternal Uncle     History   Social History  . Marital Status: Married    Spouse Name: N/A  . Number of Children: N/A  . Years of  Education: N/A   Occupational History  . Not on file.   Social History Main Topics  . Smoking status: Never Smoker   . Smokeless tobacco: Never Used  . Alcohol Use: Yes     Comment: 11/30/2014 "glass of wine q couple weeks"  . Drug Use: No  . Sexual Activity: Yes   Other Topics Concern  . Not on file   Social History Narrative    REVIEW OF SYSTEMS: Constitutional:  Stable weight.  Exercises at gym regularly. ENT:  No nose bleeds Pulm:  No SOB or cough CV:  No palpitations, no LE edema. No chest pain GU:  No hematuria, no frequency GI:  Per HPI.   Heme:  No issues with unusual bleeding or bruising   Transfusions:  None ever.  Neuro:  No headaches, no peripheral tingling or numbness Derm:  No itching, no rash or sores.  Endocrine:  No sweats or chills.  No polyuria or dysuria Immunization:  No flu shot this year.  Travel:  None beyond local counties in last few months.     PHYSICAL EXAM: Vital signs in last 24 hours: Filed Vitals:   12/01/14 0900  BP: 122/68  Pulse: 81  Temp: 98.4 F (36.9 C)  Resp: 16   Wt Readings from Last 3 Encounters:  11/30/14 193 lb 3.2 oz (87.635 kg)    General: overweight, healthy and comfortable appearing AAF.  Excellent historian.  Head:  No asymmetry or facial swelling.    Eyes:  No icterus or pallor.  EONI.  No conj pallor.  Ears:  Not HOH  Nose:  No congestion or discharge.  Mouth:  Moist, clear, good dentition.  Neck:  No mass, no TMG.   Lungs:  Clear bil.  BS reduced in right base.  No cough or dyspnea Heart: RRR,  No mrg.  S1/S2 present Abdomen:  Soft, active BS, ND.  Slightly tender in epigastric area/RUQ.  No G/R. Marland Kitchen   Rectal: deferred   Musc/Skeltl: no joint swelling or deformity Extremities:  No CCE  Neurologic:  Oriented x 3.  Full limb strength.  No tremor.   Skin:  No rash, sores, itching.  Tattoos:  none Nodes:  No cervical adenopathy.    Psych:  Pleasant, engaged, relaxed, cooperative.   Intake/Output from previous day: 03/16 0701 - 03/17 0700 In: 911 [I.V.:911] Out: -  Intake/Output this shift:    LAB RESULTS:  Recent Labs  11/30/14 1638 11/30/14 1643  WBC 7.6  --   HGB 12.3 15.0  HCT 38.0 44.0  PLT 294  --    BMET Lab Results  Component Value Date   NA 136 12/01/2014   NA 139 11/30/2014   K 3.8 12/01/2014   K 4.6 11/30/2014   CL 105 12/01/2014   CL 102 11/30/2014   CO2 20 12/01/2014   GLUCOSE 95 12/01/2014   GLUCOSE 123* 11/30/2014   BUN 5* 12/01/2014   BUN 4* 11/30/2014   CREATININE 0.83 12/01/2014   CREATININE 0.80 11/30/2014   CALCIUM 8.9 12/01/2014   LFT  Recent Labs  11/30/14 1638 12/01/14 0612  PROT 7.6 7.0  ALBUMIN 4.0 3.3*  AST 913* 1972*  ALT 591* 1419*  ALKPHOS 128* 132*  BILITOT 1.0 2.6*  BILIDIR 0.5  --   IBILI 0.5  --    PT/INR No results found for: INR, PROTIME Hepatitis Panel No results for input(s): HEPBSAG, HCVAB, HEPAIGM, HEPBIGM in  the last 72 hours.     Component Value  Date/Time   LIPASE 338* 11/30/2014 1638    Drugs of Abuse  No results found for: LABOPIA, COCAINSCRNUR, LABBENZ, AMPHETMU, THCU, LABBARB   RADIOLOGY STUDIES: Ct Abdomen Pelvis W Contrast 11/30/2014  COMPARISON:  None.  FINDINGS: The visualized lung bases are clear.  There is mild dilatation of the intrahepatic biliary ducts, with dilatation of the common bile duct to 1.0 cm. This raises concern for distal obstruction, though no distal obstructing stone is seen. The liver and spleen are otherwise unremarkable. Multiple stones are seen within the gallbladder. The gallbladder is otherwise unremarkable. The pancreas and adrenal glands are within normal limits.  The kidneys are unremarkable in appearance. There is no evidence of hydronephrosis. No renal or ureteral stones are seen. No perinephric stranding is appreciated.  No free fluid is identified. The small bowel is unremarkable in appearance. The stomach is within normal limits. No acute vascular abnormalities are seen. An apparent small anterior abdominal wall mesh is noted at the umbilicus. The mesh is angled posteriorly at the left lower corner, of uncertain significance.  The appendix is normal in caliber and contains air, without evidence for appendicitis. Contrast progresses to the level of the proximal descending colon. The colon is unremarkable in appearance.  The bladder is mildly distended and grossly unremarkable. The uterus is grossly unremarkable in appearance. A 3.7 cm right adnexal cystic lesion is likely physiologic, though pelvic ultrasound could be considered if deemed clinically appropriate. The left ovary is unremarkable in appearance. No inguinal lymphadenopathy is seen.  No acute osseous abnormalities are identified. Prominent heterotopic bone formation along the left inferior pubic ramus may reflect remote injury.  IMPRESSION: 1. Small abdominal wall mesh at the umbilicus is angled posteriorly  at the left lower corner, of uncertain significance. No evidence of recurrence of periumbilical hernia. If the patient's symptoms persist, there is a chance that the posteriorly angled corner of the mesh is pushing against intra-abdominal structures, though no significant soft tissue inflammation is seen at this time. 2. Mild dilatation of the intrahepatic biliary ducts, with dilatation of the common bile duct to 1.0 cm. This raises concern for distal obstruction, though no distal obstructing stone is seen. Would correlate with LFTs and symptoms, and consider MRCP or ERCP for further evaluation.   Electronically Signed   By: Roanna Raider M.D.   On: 11/30/2014 19:56    ENDOSCOPIC STUDIES: none  IMPRESSION:   *  Biliary pancreatitis.  Acute.  Clinically looks good today.   *  Choledocholithiasis?  CBD dilated and LFTs rising. No debris or stones seen on the CT scan   *  Cholelithiasis.   *  S/p 2005 umbilical hernia repair with mesh. S/p C section x 2.      PLAN:     *  MRCP today.  Will arrange time for ERCP tomorrow at 0845 in case this is required, risks/benefits/technicalities of an ERCP d/w pt.    Jennye Moccasin  12/01/2014, 11:27 AM Pager: [phone]  GI ATTENDING  History, laboratories, x-rays reviewed. Patient seen and examined. She presents to the hospital with mild acute biliary pancreatitis. Feeling better. CT revealed cholelithiasis and ductal dilation. Abnormal LFTs as above. Question, did she pass a stone versus retained stone versus passed a stone with dilation secondary to edema from pancreatitis. In any event, she is feeling better, we'll proceed with MRCP. If common duct stone identified then ERCP followed by laparoscopic cholecystectomy. However, if no stone identified, I recommend laparoscopic cholecystectomy with IOC straightaway. Thanks  Wilhemina BonitoJohn N. Eda KeysPerry, Jr., M.D. [facility]

## 2014-12-02 ENCOUNTER — Inpatient Hospital Stay (HOSPITAL_COMMUNITY): Payer: 59 | Admitting: Anesthesiology

## 2014-12-02 ENCOUNTER — Encounter (HOSPITAL_COMMUNITY)
Admission: EM | Disposition: A | Discharge status: Home / Self Care | Payer: Self-pay | Source: Home / Self Care | Attending: Family Medicine

## 2014-12-02 DIAGNOSIS — K8020 Calculus of gallbladder without cholecystitis without obstruction: Secondary | ICD-10-CM | POA: Insufficient documentation

## 2014-12-02 HISTORY — PX: CHOLECYSTECTOMY: SHX55

## 2014-12-02 LAB — COMPREHENSIVE METABOLIC PANEL
ALBUMIN: 3.2 g/dL — AB (ref 3.5–5.2)
ALK PHOS: 135 U/L — AB (ref 39–117)
ALT: 1039 U/L — ABNORMAL HIGH (ref 0–35)
AST: 641 U/L — AB (ref 0–37)
Anion gap: 6 (ref 5–15)
BUN: <5 mg/dL — ABNORMAL LOW (ref 6–23)
CO2: 26 mmol/L (ref 19–32)
Calcium: 8.5 mg/dL (ref 8.4–10.5)
Chloride: 108 mmol/L (ref 96–112)
Creatinine, Ser: 0.78 mg/dL (ref 0.50–1.10)
GFR calc Af Amer: >90 mL/min (ref >90)
GFR calc non Af Amer: >90 mL/min (ref >90)
Glucose, Bld: 129 mg/dL — ABNORMAL HIGH (ref 70–99)
POTASSIUM: 3.8 mmol/L (ref 3.5–5.1)
Sodium: 140 mmol/L (ref 135–145)
Total Bilirubin: 1.4 mg/dL — ABNORMAL HIGH (ref 0.3–1.2)
Total Protein: 6.4 g/dL (ref 6.0–8.3)

## 2014-12-02 SURGERY — LAPAROSCOPIC CHOLECYSTECTOMY WITH INTRAOPERATIVE CHOLANGIOGRAM
Anesthesia: General

## 2014-12-02 SURGERY — ERCP, WITH INTERVENTION IF INDICATED
Anesthesia: General

## 2014-12-02 SURGERY — LAPAROSCOPIC CHOLECYSTECTOMY
Anesthesia: General | Site: Abdomen

## 2014-12-02 MED ORDER — ONDANSETRON HCL 4 MG/2ML IJ SOLN
INTRAMUSCULAR | Status: DC | PRN
  Administered 2014-12-02: 4 mg via INTRAVENOUS

## 2014-12-02 MED ORDER — SODIUM CHLORIDE 0.9 % IR SOLN
Status: DC | PRN
  Administered 2014-12-02: 1000 mL

## 2014-12-02 MED ORDER — LIDOCAINE HCL (CARDIAC) 20 MG/ML IV SOLN
INTRAVENOUS | Status: AC
  Filled 2014-12-02: qty 5

## 2014-12-02 MED ORDER — ROCURONIUM BROMIDE 50 MG/5ML IV SOLN
INTRAVENOUS | Status: AC
  Filled 2014-12-02: qty 1

## 2014-12-02 MED ORDER — GLYCOPYRROLATE 0.2 MG/ML IJ SOLN
INTRAMUSCULAR | Status: DC | PRN
  Administered 2014-12-02: 0.4 mg via INTRAVENOUS

## 2014-12-02 MED ORDER — CEFAZOLIN SODIUM-DEXTROSE 2-3 GM-% IV SOLR
INTRAVENOUS | Status: DC | PRN
  Administered 2014-12-02: 2 g via INTRAVENOUS

## 2014-12-02 MED ORDER — ONDANSETRON HCL 4 MG/2ML IJ SOLN
INTRAMUSCULAR | Status: AC
  Filled 2014-12-02: qty 2

## 2014-12-02 MED ORDER — FENTANYL CITRATE 0.05 MG/ML IJ SOLN
INTRAMUSCULAR | Status: DC | PRN
  Administered 2014-12-02 (×2): 100 ug via INTRAVENOUS
  Administered 2014-12-02: 50 ug via INTRAVENOUS

## 2014-12-02 MED ORDER — 0.9 % SODIUM CHLORIDE (POUR BTL) OPTIME
TOPICAL | Status: DC | PRN
  Administered 2014-12-02: 1000 mL

## 2014-12-02 MED ORDER — HYDROMORPHONE HCL 1 MG/ML IJ SOLN
INTRAMUSCULAR | Status: AC
  Administered 2014-12-02: 0.5 mg via INTRAVENOUS
  Filled 2014-12-02: qty 1

## 2014-12-02 MED ORDER — OXYCODONE-ACETAMINOPHEN 5-325 MG PO TABS
1.0000 | ORAL_TABLET | ORAL | Status: DC | PRN
  Administered 2014-12-02: 1 via ORAL
  Administered 2014-12-03: 2 via ORAL
  Filled 2014-12-02 (×2): qty 2

## 2014-12-02 MED ORDER — NEOSTIGMINE METHYLSULFATE 10 MG/10ML IV SOLN
INTRAVENOUS | Status: DC | PRN
Start: 2014-12-02 — End: 2014-12-02
  Administered 2014-12-02: 3 mg via INTRAVENOUS

## 2014-12-02 MED ORDER — PROMETHAZINE HCL 25 MG/ML IJ SOLN
INTRAMUSCULAR | Status: AC
  Administered 2014-12-02: 6.25 mg via INTRAVENOUS
  Filled 2014-12-02: qty 1

## 2014-12-02 MED ORDER — MIDAZOLAM HCL 2 MG/2ML IJ SOLN
INTRAMUSCULAR | Status: AC
  Filled 2014-12-02: qty 2

## 2014-12-02 MED ORDER — PROMETHAZINE HCL 25 MG/ML IJ SOLN
6.2500 mg | INTRAMUSCULAR | Status: DC | PRN
  Administered 2014-12-02: 6.25 mg via INTRAVENOUS

## 2014-12-02 MED ORDER — PROPOFOL 10 MG/ML IV BOLUS
INTRAVENOUS | Status: DC | PRN
  Administered 2014-12-02: 200 mg via INTRAVENOUS

## 2014-12-02 MED ORDER — KETOROLAC TROMETHAMINE 30 MG/ML IJ SOLN
INTRAMUSCULAR | Status: DC | PRN
  Administered 2014-12-02: 30 mg via INTRAVENOUS

## 2014-12-02 MED ORDER — BUPIVACAINE-EPINEPHRINE (PF) 0.25% -1:200000 IJ SOLN
INTRAMUSCULAR | Status: AC
  Filled 2014-12-02: qty 30

## 2014-12-02 MED ORDER — LACTATED RINGERS IV SOLN
INTRAVENOUS | Status: DC
  Administered 2014-12-02: 08:00:00 via INTRAVENOUS

## 2014-12-02 MED ORDER — DEXAMETHASONE SODIUM PHOSPHATE 4 MG/ML IJ SOLN
INTRAMUSCULAR | Status: AC
  Filled 2014-12-02: qty 2

## 2014-12-02 MED ORDER — MIDAZOLAM HCL 5 MG/5ML IJ SOLN
INTRAMUSCULAR | Status: DC | PRN
  Administered 2014-12-02: 2 mg via INTRAVENOUS

## 2014-12-02 MED ORDER — HYDROMORPHONE HCL 1 MG/ML IJ SOLN
0.2500 mg | INTRAMUSCULAR | Status: DC | PRN
  Administered 2014-12-02: 0.5 mg via INTRAVENOUS

## 2014-12-02 MED ORDER — OXYCODONE HCL 5 MG PO TABS: 5.0000 mg | ORAL_TABLET | Freq: Once | ORAL | Status: DC | PRN

## 2014-12-02 MED ORDER — MORPHINE SULFATE 2 MG/ML IJ SOLN: 1.0000 mg | INTRAMUSCULAR | Status: DC | PRN

## 2014-12-02 MED ORDER — LACTATED RINGERS IV SOLN
INTRAVENOUS | Status: DC | PRN
  Administered 2014-12-02 (×2): via INTRAVENOUS

## 2014-12-02 MED ORDER — OXYCODONE HCL 5 MG/5ML PO SOLN: 5.0000 mg | Freq: Once | ORAL | Status: DC | PRN

## 2014-12-02 MED ORDER — SODIUM CHLORIDE 0.9 % IV SOLN
INTRAVENOUS | Status: DC | PRN
  Administered 2014-12-02: 50 mL

## 2014-12-02 MED ORDER — FENTANYL CITRATE 0.05 MG/ML IJ SOLN
INTRAMUSCULAR | Status: AC
  Filled 2014-12-02: qty 5

## 2014-12-02 MED ORDER — BUPIVACAINE-EPINEPHRINE 0.25% -1:200000 IJ SOLN
INTRAMUSCULAR | Status: DC | PRN
  Administered 2014-12-02: 20 mL

## 2014-12-02 MED ORDER — PROPOFOL 10 MG/ML IV BOLUS
INTRAVENOUS | Status: AC
  Filled 2014-12-02: qty 20

## 2014-12-02 MED ORDER — LIDOCAINE HCL (CARDIAC) 20 MG/ML IV SOLN
INTRAVENOUS | Status: DC | PRN
  Administered 2014-12-02: 80 mg via INTRAVENOUS

## 2014-12-02 MED ORDER — ROCURONIUM BROMIDE 100 MG/10ML IV SOLN
INTRAVENOUS | Status: DC | PRN
  Administered 2014-12-02: 5 mg via INTRAVENOUS
  Administered 2014-12-02: 35 mg via INTRAVENOUS

## 2014-12-02 SURGICAL SUPPLY — 33 items
APPLIER CLIP 5 13 M/L LIGAMAX5 (MISCELLANEOUS) ×2
CANISTER SUCTION 2500CC (MISCELLANEOUS) ×2 IMPLANT
CHLORAPREP W/TINT 26ML (MISCELLANEOUS) ×2 IMPLANT
CLIP APPLIE 5 13 M/L LIGAMAX5 (MISCELLANEOUS) ×1 IMPLANT
COVER SURGICAL LIGHT HANDLE (MISCELLANEOUS) ×2 IMPLANT
DRAPE LAPAROSCOPIC ABDOMINAL (DRAPES) ×2 IMPLANT
ELECT REM PT RETURN 9FT ADLT (ELECTROSURGICAL) ×2
ELECTRODE REM PT RTRN 9FT ADLT (ELECTROSURGICAL) ×1 IMPLANT
GLOVE BIOGEL PI IND STRL 7.0 (GLOVE) ×3 IMPLANT
GLOVE BIOGEL PI INDICATOR 7.0 (GLOVE) ×3
GLOVE SURG SIGNA 7.5 PF LTX (GLOVE) ×2 IMPLANT
GLOVE SURG SS PI 7.0 STRL IVOR (GLOVE) ×4 IMPLANT
GOWN STRL REUS W/ TWL LRG LVL3 (GOWN DISPOSABLE) ×2 IMPLANT
GOWN STRL REUS W/ TWL XL LVL3 (GOWN DISPOSABLE) ×1 IMPLANT
GOWN STRL REUS W/TWL LRG LVL3 (GOWN DISPOSABLE) ×2
GOWN STRL REUS W/TWL XL LVL3 (GOWN DISPOSABLE) ×1
KIT BASIN OR (CUSTOM PROCEDURE TRAY) ×2 IMPLANT
KIT ROOM TURNOVER OR (KITS) ×2 IMPLANT
LIQUID BAND (GAUZE/BANDAGES/DRESSINGS) ×2 IMPLANT
NS IRRIG 1000ML POUR BTL (IV SOLUTION) ×2 IMPLANT
PAD ARMBOARD 7.5X6 YLW CONV (MISCELLANEOUS) ×2 IMPLANT
POUCH SPECIMEN RETRIEVAL 10MM (ENDOMECHANICALS) ×2 IMPLANT
SCISSORS LAP 5X35 DISP (ENDOMECHANICALS) ×2 IMPLANT
SET IRRIG TUBING LAPAROSCOPIC (IRRIGATION / IRRIGATOR) ×2 IMPLANT
SLEEVE ENDOPATH XCEL 5M (ENDOMECHANICALS) ×4 IMPLANT
SPECIMEN JAR SMALL (MISCELLANEOUS) ×2 IMPLANT
SUT MON AB 4-0 PC3 18 (SUTURE) ×2 IMPLANT
TOWEL OR 17X24 6PK STRL BLUE (TOWEL DISPOSABLE) ×2 IMPLANT
TOWEL OR 17X26 10 PK STRL BLUE (TOWEL DISPOSABLE) IMPLANT
TRAY LAPAROSCOPIC (CUSTOM PROCEDURE TRAY) ×2 IMPLANT
TROCAR XCEL BLUNT TIP 100MML (ENDOMECHANICALS) ×2 IMPLANT
TROCAR XCEL NON-BLD 5MMX100MML (ENDOMECHANICALS) ×2 IMPLANT
TUBING INSUFFLATION (TUBING) ×2 IMPLANT

## 2014-12-02 NOTE — Anesthesia Preprocedure Evaluation (Addendum)
Anesthesia Evaluation  Patient identified by MRN, date of birth, ID band Patient awake    Reviewed: Allergy & Precautions, H&P , NPO status , Patient's Chart, lab work & pertinent test results  Airway Mallampati: II  TM Distance: >3 FB Neck ROM: full    Dental   Pulmonary neg pulmonary ROS,  breath sounds clear to auscultation        Cardiovascular negative cardio ROS      Neuro/Psych negative neurological ROS  negative psych ROS   GI/Hepatic Neg liver ROS,   Endo/Other  negative endocrine ROS  Renal/GU negative Renal ROS     Musculoskeletal   Abdominal   Peds  Hematology   Anesthesia Other Findings   Reproductive/Obstetrics negative OB ROS                            Anesthesia Physical Anesthesia Plan  ASA: II  Anesthesia Plan: General ETT   Post-op Pain Management:    Induction:   Airway Management Planned:   Additional Equipment:   Intra-op Plan:   Post-operative Plan:   Informed Consent:   Dental Advisory Given  Plan Discussed with:   Anesthesia Plan Comments:         Anesthesia Quick Evaluation

## 2014-12-02 NOTE — Discharge Instructions (Signed)
[facility] ______[facility] °LAPAROSCOPIC SURGERY: POST OP INSTRUCTIONS °Always review your discharge instruction sheet given to you by the facility where your surgery was performed. °IF YOU HAVE DISABILITY OR FAMILY LEAVE FORMS, YOU MUST BRING THEM TO THE OFFICE FOR PROCESSING.   °DO NOT GIVE THEM TO YOUR DOCTOR. ° °1. A prescription for pain medication may be given to you upon discharge.  Take your pain medication as prescribed, if needed.  If narcotic pain medicine is not needed, then you may take acetaminophen (Tylenol) or ibuprofen (Advil) as needed. °2. Take your usually prescribed medications unless otherwise directed. °3. If you need a refill on your pain medication, please contact your pharmacy.  They will contact our office to request authorization. Prescriptions will not be filled after 5pm or on week-ends. °4. You should follow a light diet the first few days after arrival home, such as soup and crackers, etc.  Be sure to include lots of fluids daily. °5. Most patients will experience some swelling and bruising in the area of the incisions.  Ice packs will help.  Swelling and bruising can take several days to resolve.  °6. It is common to experience some constipation if taking pain medication after surgery.  Increasing fluid intake and taking a stool softener (such as Colace) will usually help or prevent this problem from occurring.  A mild laxative (Milk of Magnesia or Miralax) should be taken according to package instructions if there are no bowel movements after 48 hours. °7. Unless discharge instructions indicate otherwise, you may remove your bandages 24-48 hours after surgery, and you may shower at that time.  You may have steri-strips (small skin tapes) in place directly over the incision.  These strips should be left on the skin for 7-10 days.  If your surgeon used skin glue on the incision, you may shower in 24 hours.  The glue will flake off over the next 2-3 weeks.  Any sutures or  staples will be removed at the office during your follow-up visit. °8. ACTIVITIES:  You may resume regular (light) daily activities beginning the next day--such as daily self-care, walking, climbing stairs--gradually increasing activities as tolerated.  You may have sexual intercourse when it is comfortable.  Refrain from any heavy lifting or straining until approved by your doctor. °a. You may drive when you are no longer taking prescription pain medication, you can comfortably wear a seatbelt, and you can safely maneuver your car and apply brakes. °b. RETURN TO WORK:  __________________________________________________________ °9. You should see your doctor in the office for a follow-up appointment approximately 2-3 weeks after your surgery.  Make sure that you call for this appointment within a day or two after you arrive home to insure a convenient appointment time. °10. OTHER INSTRUCTIONS: __________________________________________________________________________________________________________________________ __________________________________________________________________________________________________________________________ °WHEN TO CALL YOUR DOCTOR: °1. Fever over 101.0 °2. Inability to urinate °3. Continued bleeding from incision. °4. Increased pain, redness, or drainage from the incision. °5. Increasing abdominal pain ° °The clinic staff is available to answer your questions during regular business hours.  Please don’t hesitate to call and ask to speak to one of the nurses for clinical concerns.  If you have a medical emergency, go to the nearest emergency room or call 911.  A surgeon from [facility] is always on call at the hospital. °[address], [address], [place], [place]  [address] ? [address] [address], [place],    [address] °[phone] ? [phone] ? FAX [phone] °Web site:   [email or web address] °

## 2014-12-02 NOTE — Progress Notes (Signed)
Family Medicine Teaching Service Daily Progress Note Intern Pager: [phone]  Patient name: Sylvia Salinas Medical record number: [id number] Date of birth: [birth year] Age: 34 y.o. Gender: female  Primary Care Provider: No PCP Per Patient Consultants: GI Code Status: Full  Pt Overview and Major Events to Date:  3/16: Admitted for abdominal pain and elevated LFTs 3/18: underwent Lap Choley  Assessment and Plan: Sylvia Salinas is a 34 y.o. female presenting with 1 month of postprandial epigastric pain and 2 days of worsening abdominal pain and nausea. PMH is significant for umbilical hernia status post mesh repair.  # Gallstone Pancreatitis: S/p lap cholecystectomy. CT showed CBD dilation of 1cm with multiple stone within gall bladder but no obstructing stone visualized. Gall bladder and pancreas both without signs of acute inflammation. +Murphy sign on exam. Lipase 338. AST 913, ALT 591, Alk phos 128 on admission. -GI consulted; appreciate recs - Clear Liquid Diet >> will progress at Surgery recs - IVF - pain control with toradol and zofran prn nausea  - Surgery added Percocet 5/342m 1-2 tabs Q4H PRN - Follow labs - Consult surgery -- patient underwent surgery 3/18 (today)  FEN/GI: NPO, IVF NS @ 1042mhr Prophylaxis: lovenox  Disposition: Continue current management as above; pending further work-up.  Subjective:  Patient tired from procedure today. Some soreness but no significant pain.  Objective: Temp:  [97.6 F (36.4 C)-98.7 F (37.1 C)] 97.8 F (36.6 C) (03/18 1130) Pulse Rate:  [73-84] 74 (03/18 1130) Resp:  [15-19] 16 (03/18 1130) BP: (99-146)/(55-85) 136/85 mmHg (03/18 1130) SpO2:  [96 %-100 %] 98 % (03/18 1130) Physical Exam: General: Well appearing woman, fatigued, NAD, pleasant HEENT: MMM, NCAT, EOMI Cardiovascular: RRR, no MRG, 2+ dp pulses Respiratory: CTAB, normal WOB Abdomen: soft, surgical dressings in place. Extremities: WWP, no LE edema Skin:  intact, no rashes Neuro: alert and oriented, no focal deficits  Laboratory: Results for orders placed or performed during the hospital encounter of 11/30/14 (from the past 24 hour(s))  Surgical pcr screen     Status: Abnormal   Collection Time: 12/01/14  4:17 PM  Result Value Ref Range   MRSA, PCR POSITIVE (A) NEGATIVE   Staphylococcus aureus POSITIVE (A) NEGATIVE  Comprehensive metabolic panel     Status: Abnormal   Collection Time: 12/02/14  4:03 AM  Result Value Ref Range   Sodium 140 135 - 145 mmol/L   Potassium 3.8 3.5 - 5.1 mmol/L   Chloride 108 96 - 112 mmol/L   CO2 26 19 - 32 mmol/L   Glucose, Bld 129 (H) 70 - 99 mg/dL   BUN <5 (L) 6 - 23 mg/dL   Creatinine, Ser 0.78 0.50 - 1.10 mg/dL   Calcium 8.5 8.4 - 10.5 mg/dL   Total Protein 6.4 6.0 - 8.3 g/dL   Albumin 3.2 (L) 3.5 - 5.2 g/dL   AST 641 (H) 0 - 37 U/L   ALT 1039 (H) 0 - 35 U/L   Alkaline Phosphatase 135 (H) 39 - 117 U/L   Total Bilirubin 1.4 (H) 0.3 - 1.2 mg/dL   GFR calc non Af Amer >90 >90 mL/min   GFR calc Af Amer >90 >90 mL/min   Anion gap 6 5 - 15    Imaging/Diagnostic Tests: Ct Abdomen Pelvis W Contrast  11/30/2014   CLINICAL DATA:  Acute onset of abdominal pain.  Initial encounter.  EXAM: CT ABDOMEN AND PELVIS WITH CONTRAST  TECHNIQUE: Multidetector CT imaging of the abdomen and pelvis was performed using the standard protocol  following bolus administration of intravenous contrast.  CONTRAST:  171m OMNIPAQUE IOHEXOL 300 MG/ML  SOLN  COMPARISON:  None.  FINDINGS: The visualized lung bases are clear.  There is mild dilatation of the intrahepatic biliary ducts, with dilatation of the common bile duct to 1.0 cm. This raises concern for distal obstruction, though no distal obstructing stone is seen. The liver and spleen are otherwise unremarkable. Multiple stones are seen within the gallbladder. The gallbladder is otherwise unremarkable. The pancreas and adrenal glands are within normal limits.  The kidneys are  unremarkable in appearance. There is no evidence of hydronephrosis. No renal or ureteral stones are seen. No perinephric stranding is appreciated.  No free fluid is identified. The small bowel is unremarkable in appearance. The stomach is within normal limits. No acute vascular abnormalities are seen. An apparent small anterior abdominal wall mesh is noted at the umbilicus. The mesh is angled posteriorly at the left lower corner, of uncertain significance.  The appendix is normal in caliber and contains air, without evidence for appendicitis. Contrast progresses to the level of the proximal descending colon. The colon is unremarkable in appearance.  The bladder is mildly distended and grossly unremarkable. The uterus is grossly unremarkable in appearance. A 3.7 cm right adnexal cystic lesion is likely physiologic, though pelvic ultrasound could be considered if deemed clinically appropriate. The left ovary is unremarkable in appearance. No inguinal lymphadenopathy is seen.  No acute osseous abnormalities are identified. Prominent heterotopic bone formation along the left inferior pubic ramus may reflect remote injury.  IMPRESSION: 1. Small abdominal wall mesh at the umbilicus is angled posteriorly at the left lower corner, of uncertain significance. No evidence of recurrence of periumbilical hernia. If the patient's symptoms persist, there is a chance that the posteriorly angled corner of the mesh is pushing against intra-abdominal structures, though no significant soft tissue inflammation is seen at this time. 2. Mild dilatation of the intrahepatic biliary ducts, with dilatation of the common bile duct to 1.0 cm. This raises concern for distal obstruction, though no distal obstructing stone is seen. Would correlate with LFTs and symptoms, and consider MRCP or ERCP for further evaluation.   Electronically Signed   By: JGarald BaldingM.D.   On: 11/30/2014 19:56    IElberta Leatherwood MD 12/02/2014, 12:21 PM PGY-1, CBeeIntern pager: [phone] text pages welcome

## 2014-12-02 NOTE — Transfer of Care (Signed)
Immediate Anesthesia Transfer of Care Note  Patient: Sylvia Salinas  Procedure(s) Performed: Procedure(s): LAPAROSCOPIC CHOLECYSTECTOMY WITH ATTEMPTED INTRAOPERATIVE CHOLANGIOGRAM (N/A)  Patient Location: PACU  Anesthesia Type:General  Level of Consciousness: awake, alert , oriented and patient cooperative  Airway & Oxygen Therapy: Patient Spontanous Breathing and Patient connected to nasal cannula oxygen  Post-op Assessment: Report given to RN and Post -op Vital signs reviewed and stable  Post vital signs: Reviewed and stable  Last Vitals:  Filed Vitals:   12/02/14 0521  BP: 99/55  Pulse: 76  Temp: 36.9 C  Resp: 18    Complications: No apparent anesthesia complications

## 2014-12-02 NOTE — Op Note (Signed)
LAPAROSCOPIC CHOLECYSTECTOMY WITH ATTEMPTED INTRAOPERATIVE CHOLANGIOGRAM  Procedure Note  Sylvia Salinas 11/30/2014 - 12/02/2014   Pre-op Diagnosis: GALLSTONE PANCREATITIS     Post-op Diagnosis: same  Procedure(s): LAPAROSCOPIC CHOLECYSTECTOMY WITH ATTEMPTED INTRAOPERATIVE CHOLANGIOGRAM  Surgeon(s): Abigail Miyamotoouglas Hosie Sharman, MD  Anesthesia: General  Staff:  Circulator: Lowella PettiesMegan D Cavanaugh, RN Scrub Person: Llana AlimentAnn M Wilson, CST; Lina SayreEmily E Ellis, RN Circulator Assistant: Lina SayreEmily E Ellis, RN  Estimated Blood Loss: Minimal               Specimens: sent to path          [facility],[name]   Date: 12/02/2014  Time: 9:22 AM

## 2014-12-02 NOTE — Op Note (Signed)
NAMMarland Kitchen:  Sylvia Salinas, Sylvia          ACCOUNT NO.:  [id number]  MEDICAL RECORD NO.:  [id number]  LOCATION:  OTFC                         FACILITY:  [facility]  PHYSICIAN:  Sylvia Miyamotoouglas Leroy Pettway, M.D. DATE OF BIRTH:  April 11, 1981  DATE OF PROCEDURE:  12/02/2014 DATE OF DISCHARGE:                              OPERATIVE REPORT   PREOPERATIVE DIAGNOSIS:  Gallstone pancreatitis.  POSTOPERATIVE DIAGNOSIS:  Gallstone pancreatitis.  PROCEDURE:  Laparoscopic cholecystectomy with attempted intraoperative cholangiogram.  SURGEON:  Sylvia Miyamotoouglas Norvell Ureste, M.D.  ANESTHESIA:  General endotracheal anesthesia and 0.25% Marcaine.  ESTIMATED BLOOD LOSS:  Minimal.  INDICATIONS:  This is a 34 year old female who presented with gallstone pancreatitis and elevated liver function test.  She had a preoperative MRCP, which showed a dilated common bile duct, but no evidence of obstructing stone.  Decision was made to proceed to the operating room.  FINDINGS:  The patient was found to have a mildly inflamed gallbladder. The liver was normal in appearance.  I was unable to perform any cholangiogram as I could not get the catheter to go down the cystic duct, which appeared foreshortened.  PROCEDURE IN DETAIL:  The patient was brought to the operating room, identified as Sylvia Salinas.  She was placed supine on the operating room table and general anesthesia was induced.  Her abdomen was then prepped and draped in usual sterile fashion.  She had a previous umbilical hernia repair with mesh, so I made an incision just above the umbilicus with a scalpel and took this down to the fascia, which was then opened with scalpel.  A hemostat was then used to pass through the peritoneal cavity under direct vision.  A 0 Vicryl pursestring suture was then placed around the fascial opening.  The Hasson port was placed through the opening and insufflation of the abdomen was begun.  I placed a 5-mm port in the patient's  epigastrium and two more in the right upper quadrant, all under direct vision.  The liver was normal in appearance.  The gallbladder was mildly inflamed.  I grasped and retracted it above the liver bed.  Dissection was then carried out at the base of the gallbladder.  The node to the gallbladder was sitting right over the cystic duct.  I was able to achieve a critical window around the duct and artery as well and I clipped the artery twice proximally and once distally.  I then clipped the cystic duct once distally.  I opened up with laparoscopic scissors.  I then placed a cholangiocatheter through an opening in the right upper quadrant.  This again was done under direct vision.  I tried multiple attempts to feed the catheter into the cystic duct, but kept meeting resistance and the duct was foreshortened; therefore, I decided to forego cholangiogram.  I thus clipped the cystic duct twice proximally and completely transected it.  I then transected the cystic artery after it had been clipped.  I then slowly dissected free the gallbladder from the liver bed.  Once this was freed from liver bed, I placed an Endosac and removed it through the incision at the umbilicus.  I then examined the umbilicus and the patient was found to have multiple adhesions to the midline  at the hernia repair, which I left in place.  Again, the abdomen was irrigated with normal saline.  Hemostasis appeared to be achieved.  All ports were removed under direct vision and the abdomen was deflated.  All incisions were then anesthetized with Marcaine and closed with 4-0 Monocryl subcuticular sutures.  Dermabond was then applied.  The patient tolerated the procedure well.  All the counts were correct at the end of the procedure.  The patient was then extubated in the operating room and taken in stable condition to the recovery room.     Sylvia Salinas, M.D.     DB/MEDQ  D:  12/02/2014  T:  12/02/2014  Job:   [id number]

## 2014-12-02 NOTE — Anesthesia Postprocedure Evaluation (Signed)
Anesthesia Post Note  Patient: Sylvia Salinas  Procedure(s) Performed: Procedure(s) (LRB): LAPAROSCOPIC CHOLECYSTECTOMY WITH ATTEMPTED INTRAOPERATIVE CHOLANGIOGRAM (N/A)  Anesthesia type: general  Patient location: PACU  Post pain: Pain level controlled  Post assessment: Patient's Cardiovascular Status Stable  Last Vitals:  Filed Vitals:   12/02/14 1030  BP: 122/65  Pulse: 79  Temp:   Resp: 17    Post vital signs: Reviewed and stable  Level of consciousness: sedated  Complications: No apparent anesthesia complications

## 2014-12-03 ENCOUNTER — Encounter (HOSPITAL_COMMUNITY): Payer: Self-pay | Admitting: *Deleted

## 2014-12-03 LAB — COMPREHENSIVE METABOLIC PANEL
ALT: 677 U/L — AB (ref 0–35)
AST: 232 U/L — ABNORMAL HIGH (ref 0–37)
Albumin: 2.9 g/dL — ABNORMAL LOW (ref 3.5–5.2)
Alkaline Phosphatase: 126 U/L — ABNORMAL HIGH (ref 39–117)
Anion gap: 8 (ref 5–15)
BILIRUBIN TOTAL: 0.9 mg/dL (ref 0.3–1.2)
BUN: <5 mg/dL — ABNORMAL LOW (ref 6–23)
CO2: 23 mmol/L (ref 19–32)
Calcium: 8.3 mg/dL — ABNORMAL LOW (ref 8.4–10.5)
Chloride: 107 mmol/L (ref 96–112)
Creatinine, Ser: 0.75 mg/dL (ref 0.50–1.10)
GFR calc Af Amer: >90 mL/min (ref >90)
GFR calc non Af Amer: >90 mL/min (ref >90)
Glucose, Bld: 148 mg/dL — ABNORMAL HIGH (ref 70–99)
POTASSIUM: 3.8 mmol/L (ref 3.5–5.1)
SODIUM: 138 mmol/L (ref 135–145)
Total Protein: 6.4 g/dL (ref 6.0–8.3)

## 2014-12-03 MED ORDER — OXYCODONE-ACETAMINOPHEN 5-325 MG PO TABS: 1.0000 | ORAL_TABLET | ORAL | Status: DC | PRN

## 2014-12-03 NOTE — Progress Notes (Signed)
1 Day Post-Op  Subjective: No complaints other than soreness. Tolerating diet  Objective: Vital signs in last 24 hours: Temp:  [97.6 F (36.4 C)-99.1 F (37.3 C)] 98.2 F (36.8 C) (03/19 0614) Pulse Rate:  [71-111] 71 (03/19 0614) Resp:  [15-18] 16 (03/19 0614) BP: (98-146)/(53-85) 102/69 mmHg (03/19 0614) SpO2:  [98 %-100 %] 100 % (03/19 0614) Last BM Date: 11/30/14  Intake/Output from previous day: 03/18 0701 - 03/19 0700 In: 2615 [P.O.:240; I.V.:2375] Out: 40 [Blood:40] Intake/Output this shift:    Resp: clear to auscultation bilaterally Cardio: regular rate and rhythm GI: soft, mild tenderness. incisions look good  Lab Results:   Recent Labs  11/30/14 1638 11/30/14 1643 12/01/14 0612  WBC 7.6  --  5.6  HGB 12.3 15.0 11.6*  HCT 38.0 44.0 36.3  PLT 294  --  273   BMET  Recent Labs  12/02/14 0403 12/03/14 0412  NA 140 138  K 3.8 3.8  CL 108 107  CO2 26 23  GLUCOSE 129* 148*  BUN <5* <5*  CREATININE 0.78 0.75  CALCIUM 8.5 8.3*   PT/INR No results for input(s): LABPROT, INR in the last 72 hours. ABG No results for input(s): PHART, HCO3 in the last 72 hours.  Invalid input(s): PCO2, PO2  Studies/Results: Mr 3d Recon At Scanner  12/01/2014   CLINICAL DATA:  Epigastric pain. Nausea and vomiting. Acute pancreatitis. Biliary ductal dilatation seen on recent CT.  EXAM: MRI ABDOMEN WITHOUT AND WITH CONTRAST (INCLUDING MRCP)  TECHNIQUE: Multiplanar multisequence MR imaging of the abdomen was performed both before and after the administration of intravenous contrast. Heavily T2-weighted images of the biliary and pancreatic ducts were obtained, and three-dimensional MRCP images were rendered by post processing.  CONTRAST:  20mL MULTIHANCE GADOBENATE DIMEGLUMINE 529 MG/ML IV SOLN  COMPARISON:  CT on 11/30/2014  FINDINGS: Lower chest:  Unremarkable.  Hepatobiliary: No masses or other significant abnormality identified. Multiple gallstones are noted, without definite  evidence of acute cholecystitis.  No evidence of biliary ductal dilatation with common bile duct 5 mm. No evidence of choledocholithiasis.  Pancreas: No masses, inflammatory changes, or fluid collections demonstrated. There is no evidence of pancreatic ductal dilatation. Pancreas divisum is demonstrated however.  Spleen:  Within normal limits in size and appearance.  Adrenal Glands:  No masses identified.  Kidneys: No renal masses identified. No evidence of hydronephrosis.  Stomach/Bowel/Peritoneum: No evidence of wall thickening, mass, or obstruction involving visualized abdominal bowel.  Vascular/Lymphatic: No pathologically enlarged lymph nodes identified. No other significant abnormality noted.  Other:  None.  Musculoskeletal:  No suspicious bone lesions identified.  IMPRESSION: Cholelithiasis, without definite radiographic signs of acute cholecystitis.  No evidence of biliary ductal dilatation or choledocholithiasis.  Pancreas divisum noted, which is a predisposing factor for pancreatitis. No radiographic evidence of acute pancreatitis at this time.   Electronically Signed   By: Myles Rosenthal M.D.   On: 12/01/2014 14:24   Mr Abd W/wo Cm/mrcp  12/01/2014   CLINICAL DATA:  Epigastric pain. Nausea and vomiting. Acute pancreatitis. Biliary ductal dilatation seen on recent CT.  EXAM: MRI ABDOMEN WITHOUT AND WITH CONTRAST (INCLUDING MRCP)  TECHNIQUE: Multiplanar multisequence MR imaging of the abdomen was performed both before and after the administration of intravenous contrast. Heavily T2-weighted images of the biliary and pancreatic ducts were obtained, and three-dimensional MRCP images were rendered by post processing.  CONTRAST:  20mL MULTIHANCE GADOBENATE DIMEGLUMINE 529 MG/ML IV SOLN  COMPARISON:  CT on 11/30/2014  FINDINGS: Lower chest:  Unremarkable.  Hepatobiliary: No masses or other significant abnormality identified. Multiple gallstones are noted, without definite evidence of acute cholecystitis.  No  evidence of biliary ductal dilatation with common bile duct 5 mm. No evidence of choledocholithiasis.  Pancreas: No masses, inflammatory changes, or fluid collections demonstrated. There is no evidence of pancreatic ductal dilatation. Pancreas divisum is demonstrated however.  Spleen:  Within normal limits in size and appearance.  Adrenal Glands:  No masses identified.  Kidneys: No renal masses identified. No evidence of hydronephrosis.  Stomach/Bowel/Peritoneum: No evidence of wall thickening, mass, or obstruction involving visualized abdominal bowel.  Vascular/Lymphatic: No pathologically enlarged lymph nodes identified. No other significant abnormality noted.  Other:  None.  Musculoskeletal:  No suspicious bone lesions identified.  IMPRESSION: Cholelithiasis, without definite radiographic signs of acute cholecystitis.  No evidence of biliary ductal dilatation or choledocholithiasis.  Pancreas divisum noted, which is a predisposing factor for pancreatitis. No radiographic evidence of acute pancreatitis at this time.   Electronically Signed   By: Myles RosenthalJohn  Stahl M.D.   On: 12/01/2014 14:24    Anti-infectives: Anti-infectives    Start     Dose/Rate Route Frequency Ordered Stop   12/02/14 0600  cefTRIAXone (ROCEPHIN) 2 g in dextrose 5 % 50 mL IVPB - Premix  Status:  Discontinued    Comments:  Pharmacy may adjust dosing strength, interval, or rate of medication as needed for optimal therapy for the patient Send with patient on call to the OR.  Anesthesia to complete antibiotic administration <2960min prior to [facility].   2 g 100 mL/hr over 30 Minutes Intravenous On call to O.R. 12/01/14 1536 12/02/14 1137      Assessment/Plan: s/p Procedure(s): LAPAROSCOPIC CHOLECYSTECTOMY WITH ATTEMPTED INTRAOPERATIVE CHOLANGIOGRAM (N/A) Advance diet Discharge  LOS: 3 days    TOTH III,PAUL S 12/03/2014

## 2014-12-03 NOTE — Progress Notes (Signed)
Discharge home. Home discharge instruction given, no questions verbalized. 

## 2014-12-03 NOTE — Discharge Summary (Signed)
Physician Discharge Summary  Patient ID: Sylvia Salinas MRN: [id number] DOB/AGE: [birth year] 34 y.o.  Admit date: 11/30/2014 Discharge date: 12/03/2014  Admission Diagnoses:  Discharge Diagnoses:  Active Problems:   Pancreatitis   Acute pancreatitis   Common bile duct dilation   Nausea with vomiting   Gallstones   Discharged Condition: good  Hospital Course: the pt was admitted with gallstone pancreatitis. Once the pancreatitis improved she underwent lap chole. She did well and on pod 1 she was ready for discharge home  Consults: None  Significant Diagnostic Studies: mrcp  Treatments: surgery: as above  Discharge Exam: Blood pressure 102/69, pulse 71, temperature 98.2 F (36.8 C), temperature source Oral, resp. rate 16, height 5' 4.02" (1.626 m), weight 87.635 kg (193 lb 3.2 oz), last menstrual period 11/24/2014, SpO2 100 %. Resp: clear to auscultation bilaterally Cardio: regular rate and rhythm GI: soft, mild tenderness. incisions look good  Disposition: Final discharge disposition not confirmed  Discharge Instructions    Diet - low sodium heart healthy    Complete by:  As directed      Discharge instructions    Complete by:  As directed   May shower. Low fat diet. No heavy lifting     Increase activity slowly    Complete by:  As directed             Medication List    STOP taking these medications        bismuth subsalicylate 262 MG/15ML suspension  Commonly known as:  PEPTO BISMOL      TAKE these medications        acetaminophen 500 MG tablet  Commonly known as:  TYLENOL  Take 500 mg by mouth every 8 (eight) hours as needed (pain).     Norethindrone-Ethinyl Estradiol-Fe Biphas 1 MG-10 MCG / 10 MCG tablet  Commonly known as:  LO LOESTRIN FE  Take 1 tablet by mouth at bedtime.     oxyCODONE-acetaminophen 5-325 MG per tablet  Commonly known as:  PERCOCET/ROXICET  Take 1-2 tablets by mouth every 4 (four) hours as needed for moderate pain or severe  pain.           Follow-up Information    Follow up with [facility] On 12/20/2014.   Specialty:  General Surgery   Why:  [facility], 3:15pm, arrive no later than 2:45pm for paperwork   Contact information:   [address] [place] [address] [phone]       Signed: Robyne AskewOTH III,Zeferino Mounts S 12/03/2014, 8:10 AM

## 2014-12-03 NOTE — Progress Notes (Signed)
Family Medicine Teaching Service Daily Progress Note Intern Pager: [phone]  Patient name: Sylvia Salinas Medical record number: [id number] Date of birth: Apr 30, 1981 Age: 34 y.o. Gender: female  Primary Care Provider: No PCP Per Patient Consultants: GI Code Status: Full  Pt Overview and Major Events to Date:  3/16: Admitted for abdominal pain and elevated LFTs 3/18: underwent Lap Choley  Assessment and Plan: Sylvia Salinas is a 34 y.o. female presenting with 1 month of postprandial epigastric pain and 2 days of worsening abdominal pain and nausea. PMH is significant for umbilical hernia status post mesh repair.  # Gallstone Pancreatitis: S/p lap cholecystectomy. CT showed CBD dilation of 1cm with multiple stone within gall bladder but no obstructing stone visualized. Gall bladder and pancreas both without signs of acute inflammation. +Murphy sign on exam. Lipase 338. AST 913, ALT 591, Alk phos 128 on admission. Improved. - Soft low fat diet - SLIV -POD #1 s/p lap cholecystectomy  - pain control with toradol and zofran prn nausea  - Surgery added Percocet 5/359m 1-2 tabs Q4H PRN - Liver enzymes down trending   FEN/GI: soft diet/ SLIV Prophylaxis: lovenox  Disposition: Home today  Subjective:  Patient doing well this morning with no complaints. She does not endorse any pain at this time. She is wondering when she will be able to go back to work.   Objective: Temp:  [97.6 F (36.4 C)-99.1 F (37.3 C)] 98.2 F (36.8 C) (03/19 0614) Pulse Rate:  [71-111] 71 (03/19 0614) Resp:  [15-18] 16 (03/19 0614) BP: (98-146)/(53-85) 102/69 mmHg (03/19 0614) SpO2:  [98 %-100 %] 100 % (03/19 07026 Physical Exam: General: Well appearing woman, NAD, pleasant HEENT: MMM, NCAT, EOMI Cardiovascular: RRR, no MRG, 2+ dp pulses Respiratory: CTAB, normal WOB Abdomen: soft, +BS, non-TTP, Dermabond on 3 surgical sites Extremities: WWP, no LE edema Skin: intact, no rashes Neuro: alert  and oriented, no focal deficits  Laboratory: Results for orders placed or performed during the hospital encounter of 11/30/14 (from the past 24 hour(s))  Comprehensive metabolic panel     Status: Abnormal   Collection Time: 12/03/14  4:12 AM  Result Value Ref Range   Sodium 138 135 - 145 mmol/L   Potassium 3.8 3.5 - 5.1 mmol/L   Chloride 107 96 - 112 mmol/L   CO2 23 19 - 32 mmol/L   Glucose, Bld 148 (H) 70 - 99 mg/dL   BUN <5 (L) 6 - 23 mg/dL   Creatinine, Ser 0.75 0.50 - 1.10 mg/dL   Calcium 8.3 (L) 8.4 - 10.5 mg/dL   Total Protein 6.4 6.0 - 8.3 g/dL   Albumin 2.9 (L) 3.5 - 5.2 g/dL   AST 232 (H) 0 - 37 U/L   ALT 677 (H) 0 - 35 U/L   Alkaline Phosphatase 126 (H) 39 - 117 U/L   Total Bilirubin 0.9 0.3 - 1.2 mg/dL   GFR calc non Af Amer >90 >90 mL/min   GFR calc Af Amer >90 >90 mL/min   Anion gap 8 5 - 15    Imaging/Diagnostic Tests: Ct Abdomen Pelvis W Contrast  11/30/2014   CLINICAL DATA:  Acute onset of abdominal pain.  Initial encounter.  EXAM: CT ABDOMEN AND PELVIS WITH CONTRAST  TECHNIQUE: Multidetector CT imaging of the abdomen and pelvis was performed using the standard protocol following bolus administration of intravenous contrast.  CONTRAST:  1072mOMNIPAQUE IOHEXOL 300 MG/ML  SOLN  COMPARISON:  None.  FINDINGS: The visualized lung bases are clear.  There is  mild dilatation of the intrahepatic biliary ducts, with dilatation of the common bile duct to 1.0 cm. This raises concern for distal obstruction, though no distal obstructing stone is seen. The liver and spleen are otherwise unremarkable. Multiple stones are seen within the gallbladder. The gallbladder is otherwise unremarkable. The pancreas and adrenal glands are within normal limits.  The kidneys are unremarkable in appearance. There is no evidence of hydronephrosis. No renal or ureteral stones are seen. No perinephric stranding is appreciated.  No free fluid is identified. The small bowel is unremarkable in appearance.  The stomach is within normal limits. No acute vascular abnormalities are seen. An apparent small anterior abdominal wall mesh is noted at the umbilicus. The mesh is angled posteriorly at the left lower corner, of uncertain significance.  The appendix is normal in caliber and contains air, without evidence for appendicitis. Contrast progresses to the level of the proximal descending colon. The colon is unremarkable in appearance.  The bladder is mildly distended and grossly unremarkable. The uterus is grossly unremarkable in appearance. A 3.7 cm right adnexal cystic lesion is likely physiologic, though pelvic ultrasound could be considered if deemed clinically appropriate. The left ovary is unremarkable in appearance. No inguinal lymphadenopathy is seen.  No acute osseous abnormalities are identified. Prominent heterotopic bone formation along the left inferior pubic ramus may reflect remote injury.  IMPRESSION: 1. Small abdominal wall mesh at the umbilicus is angled posteriorly at the left lower corner, of uncertain significance. No evidence of recurrence of periumbilical hernia. If the patient's symptoms persist, there is a chance that the posteriorly angled corner of the mesh is pushing against intra-abdominal structures, though no significant soft tissue inflammation is seen at this time. 2. Mild dilatation of the intrahepatic biliary ducts, with dilatation of the common bile duct to 1.0 cm. This raises concern for distal obstruction, though no distal obstructing stone is seen. Would correlate with LFTs and symptoms, and consider MRCP or ERCP for further evaluation.   Electronically Signed   By: Garald Balding M.D.   On: 11/30/2014 19:56    Katheren Shams, DO 12/03/2014, 7:26 AM PGY-1, [place] Intern pager: [phone], text pages welcome

## 2014-12-06 ENCOUNTER — Encounter (HOSPITAL_COMMUNITY): Payer: Self-pay | Admitting: Surgery

## 2014-12-15 ENCOUNTER — Encounter (HOSPITAL_BASED_OUTPATIENT_CLINIC_OR_DEPARTMENT_OTHER): Payer: Self-pay | Admitting: Emergency Medicine

## 2014-12-15 ENCOUNTER — Inpatient Hospital Stay (HOSPITAL_BASED_OUTPATIENT_CLINIC_OR_DEPARTMENT_OTHER)
Admission: EM | Admit: 2014-12-15 | Discharge: 2014-12-17 | DRG: 439 | Disposition: A | Discharge status: Home / Self Care | Payer: 59 | Attending: Family Medicine | Admitting: Family Medicine

## 2014-12-15 ENCOUNTER — Emergency Department (HOSPITAL_BASED_OUTPATIENT_CLINIC_OR_DEPARTMENT_OTHER): Payer: 59

## 2014-12-15 DIAGNOSIS — K8050 Calculus of bile duct without cholangitis or cholecystitis without obstruction: Secondary | ICD-10-CM

## 2014-12-15 DIAGNOSIS — R748 Abnormal levels of other serum enzymes: Secondary | ICD-10-CM | POA: Diagnosis present

## 2014-12-15 DIAGNOSIS — K838 Other specified diseases of biliary tract: Secondary | ICD-10-CM | POA: Diagnosis not present

## 2014-12-15 DIAGNOSIS — K8051 Calculus of bile duct without cholangitis or cholecystitis with obstruction: Secondary | ICD-10-CM | POA: Diagnosis present

## 2014-12-15 DIAGNOSIS — R1013 Epigastric pain: Secondary | ICD-10-CM | POA: Diagnosis not present

## 2014-12-15 DIAGNOSIS — Z9049 Acquired absence of other specified parts of digestive tract: Secondary | ICD-10-CM | POA: Diagnosis present

## 2014-12-15 DIAGNOSIS — R319 Hematuria, unspecified: Secondary | ICD-10-CM | POA: Diagnosis present

## 2014-12-15 DIAGNOSIS — R7989 Other specified abnormal findings of blood chemistry: Secondary | ICD-10-CM | POA: Diagnosis present

## 2014-12-15 DIAGNOSIS — K8020 Calculus of gallbladder without cholecystitis without obstruction: Secondary | ICD-10-CM | POA: Diagnosis not present

## 2014-12-15 DIAGNOSIS — K851 Biliary acute pancreatitis: Secondary | ICD-10-CM | POA: Diagnosis present

## 2014-12-15 DIAGNOSIS — R109 Unspecified abdominal pain: Secondary | ICD-10-CM | POA: Diagnosis present

## 2014-12-15 LAB — URINALYSIS, ROUTINE W REFLEX MICROSCOPIC
Bilirubin Urine: NEGATIVE
GLUCOSE, UA: NEGATIVE mg/dL
Ketones, ur: NEGATIVE mg/dL
Nitrite: NEGATIVE
Protein, ur: NEGATIVE mg/dL
SPECIFIC GRAVITY, URINE: 1.024 (ref 1.005–1.030)
UROBILINOGEN UA: 1 mg/dL (ref 0.0–1.0)
pH: 7 (ref 5.0–8.0)

## 2014-12-15 LAB — COMPREHENSIVE METABOLIC PANEL
ALT: 335 U/L — ABNORMAL HIGH (ref 0–35)
AST: 594 U/L — AB (ref 0–37)
Albumin: 3.8 g/dL (ref 3.5–5.2)
Alkaline Phosphatase: 103 U/L (ref 39–117)
Anion gap: 10 (ref 5–15)
BUN: 9 mg/dL (ref 6–23)
CALCIUM: 9.1 mg/dL (ref 8.4–10.5)
CO2: 25 mmol/L (ref 19–32)
CREATININE: 0.71 mg/dL (ref 0.50–1.10)
Chloride: 103 mmol/L (ref 96–112)
GFR calc Af Amer: >90 mL/min (ref >90)
GFR calc non Af Amer: >90 mL/min (ref >90)
Glucose, Bld: 124 mg/dL — ABNORMAL HIGH (ref 70–99)
Potassium: 4 mmol/L (ref 3.5–5.1)
SODIUM: 138 mmol/L (ref 135–145)
TOTAL PROTEIN: 7.6 g/dL (ref 6.0–8.3)
Total Bilirubin: 0.5 mg/dL (ref 0.3–1.2)

## 2014-12-15 LAB — URINE MICROSCOPIC-ADD ON

## 2014-12-15 LAB — CBC
HCT: 36.4 % (ref 36.0–46.0)
Hemoglobin: 11.7 g/dL — ABNORMAL LOW (ref 12.0–15.0)
MCH: 27.5 pg (ref 26.0–34.0)
MCHC: 32.1 g/dL (ref 30.0–36.0)
MCV: 85.6 fL (ref 78.0–100.0)
Platelets: 352 10*3/uL (ref 150–400)
RBC: 4.25 MIL/uL (ref 3.87–5.11)
RDW: 13 % (ref 11.5–15.5)
WBC: 5.5 10*3/uL (ref 4.0–10.5)

## 2014-12-15 LAB — PREGNANCY, URINE: PREG TEST UR: NEGATIVE

## 2014-12-15 LAB — LIPASE, BLOOD: Lipase: 31 U/L (ref 11–59)

## 2014-12-15 MED ORDER — ONDANSETRON HCL 4 MG/2ML IJ SOLN
4.0000 mg | Freq: Four times a day (QID) | INTRAMUSCULAR | Status: DC | PRN
  Administered 2014-12-15 – 2014-12-16 (×4): 4 mg via INTRAVENOUS
  Filled 2014-12-15 (×4): qty 2

## 2014-12-15 MED ORDER — HEPARIN SODIUM (PORCINE) 5000 UNIT/ML IJ SOLN
5000.0000 [IU] | Freq: Three times a day (TID) | INTRAMUSCULAR | Status: AC
  Administered 2014-12-15 (×2): 5000 [IU] via SUBCUTANEOUS

## 2014-12-15 MED ORDER — MORPHINE SULFATE 2 MG/ML IJ SOLN
2.0000 mg | INTRAMUSCULAR | Status: DC | PRN
  Administered 2014-12-15 – 2014-12-16 (×4): 2 mg via INTRAVENOUS
  Filled 2014-12-15 (×4): qty 1

## 2014-12-15 MED ORDER — SODIUM CHLORIDE 0.9 % IV SOLN
INTRAVENOUS | Status: DC
  Administered 2014-12-15: 1000 mL via INTRAVENOUS

## 2014-12-15 MED ORDER — ONDANSETRON HCL 4 MG PO TABS: 4.0000 mg | ORAL_TABLET | Freq: Four times a day (QID) | ORAL | Status: DC | PRN

## 2014-12-15 MED ORDER — IOHEXOL 300 MG/ML  SOLN
100.0000 mL | Freq: Once | INTRAMUSCULAR | Status: AC | PRN
  Administered 2014-12-15: 100 mL via INTRAVENOUS

## 2014-12-15 MED ORDER — SODIUM CHLORIDE 0.9 % IV BOLUS (SEPSIS)
1000.0000 mL | Freq: Once | INTRAVENOUS | Status: AC
  Administered 2014-12-15: 1000 mL via INTRAVENOUS

## 2014-12-15 MED ORDER — MORPHINE SULFATE 4 MG/ML IJ SOLN
4.0000 mg | Freq: Once | INTRAMUSCULAR | Status: AC
Start: 2014-12-15 — End: 2014-12-15
  Administered 2014-12-15: 4 mg via INTRAVENOUS
  Filled 2014-12-15: qty 1

## 2014-12-15 MED ORDER — ONDANSETRON HCL 4 MG/2ML IJ SOLN
4.0000 mg | Freq: Once | INTRAMUSCULAR | Status: AC
  Administered 2014-12-15: 4 mg via INTRAVENOUS
  Filled 2014-12-15: qty 2

## 2014-12-15 MED ORDER — IOHEXOL 300 MG/ML  SOLN
50.0000 mL | Freq: Once | INTRAMUSCULAR | Status: AC | PRN
  Administered 2014-12-15: 50 mL via ORAL

## 2014-12-15 MED ORDER — SODIUM CHLORIDE 0.9 % IV SOLN
INTRAVENOUS | Status: AC
  Administered 2014-12-15: 10:00:00 via INTRAVENOUS

## 2014-12-15 NOTE — ED Notes (Signed)
Report called to [place] with [facility]

## 2014-12-15 NOTE — ED Notes (Signed)
Patient states that she had her gallbladder out on the 18th. Patient reports that she is having cramping and the sam symptoms that she had when she needed to get her gallbladder out.

## 2014-12-15 NOTE — Consult Note (Signed)
[facility] Consult: 12:58 PM 12/15/2014  LOS: 0 days    Referring Provider: Dr Erin Hearing  Primary Care Physician:  No PCP Per Patient Primary Gastroenterologist:  Dr. Henrene Pastor, seen as ua pt 12/01/14    Reason for Consultation:  Abnormal LFTs, abdominal pain.    HPI: Sylvia Salinas is a 34 y.o. female.  Generally healthy. S/p 4163 umbilical hernia repair with mesh. S/p c-sections in 2004 and 2010.   Admission 3/16 - 3/19 to [facility] with biliary pancreatitis, lipase max 135.  AST/ALT max 1972/1419, alk phos 135, t bili 135.  On CT CBD 1.0 cm, no CBD stone seen. MRCP: cholelithiasis, CBD 67m, pancreas divisum without pancreatitis .  S/p Lap chole 3/18, unable to achieve IOC, per Dr BGarrison Columbusnote:" gallbladder was mildly infalmed...multiple attempts to feed the catheter into the cystic duct, but kept meeting resistance and the duct was foreshortened; therefore, I decided to forego cholangiogram. I thus clipped the cystic duct twice proximally and completely transected it."  At discharge AST/ALT to 232/677, alk phos 126  Pt doing well post discharge, eating varied diet, including pizza on occasion. Not needing to use percocet or other pain meds. Returned to work 4 days ago.  Had normal formed stool yesterday. Last night ate small piece of pizza without incidence.  Went to sleep, awoke with 10/10 epigastric pain, doubled her over.  Took Advil, pain relived for about 4 hours but then recurred at 6AM so she came to ED.  Pin is also relieved for several hours with Morphine.  No nausea, emesis, fevers, sweats, malaise.  Labs now AST/ALT 594/335, alk phos 103, lipase 31.  T bili 0.5.  CT scan with no biliary, ductal or pancreatic abnormality in post lap chole pt. Stable appearance of umbilical surgical mesh.        Past Medical History  Diagnosis Date  . Acute pancreatitis 11/30/2014    Past Surgical History  Procedure Laterality Date  . Cesarean section  04/2003; 01/2009  . Hernia repair  2005  . Abdominal hernia repair  2005  . Cholecystectomy N/A 12/02/2014    Procedure: LAPAROSCOPIC CHOLECYSTECTOMY WITH ATTEMPTED INTRAOPERATIVE CHOLANGIOGRAM;  Surgeon: DCoralie Keens MD;  [place] [facility]  Service: General;  Laterality: N/A;    Prior to Admission medications   Medication Sig Start Date End Date Taking? Authorizing Provider  acetaminophen (TYLENOL) 500 MG tablet Take 500 mg by mouth every 8 (eight) hours as needed (pain).    Historical Provider, MD  Norethindrone-Ethinyl Estradiol-Fe Biphas (LO LOESTRIN FE) 1 MG-10 MCG / 10 MCG tablet Take 1 tablet by mouth at bedtime.    Historical Provider, MD  oxyCODONE-acetaminophen (PERCOCET/ROXICET) 5-325 MG per tablet Take 1-2 tablets by mouth every 4 (four) hours as needed for moderate pain or severe pain. 12/03/14   JKatheren Shams DO    Scheduled Meds: . sodium chloride   Intravenous STAT  . heparin  5,000 Units Subcutaneous 3 times per day   Infusions: . sodium chloride     PRN Meds: morphine injection, ondansetron **OR** ondansetron (ZOFRAN) IV  Allergies as of 12/15/2014  . (No Known Allergies)    Family History  Problem Relation Age of Onset  . Stroke Mother   . Stroke Father   . Kidney cancer Neg Hx   . Alcoholism Neg Hx   . Liver cancer Maternal Uncle     History   Social History  . Marital Status: Married    Spouse Name: N/A  . Number of Children: N/A  . Years of Education: N/A   Occupational History  . 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    Social History Main Topics  . Smoking status: Never Smoker   . Smokeless tobacco: Never Used  . Alcohol Use: Yes     Comment: 11/30/2014 "glass of wine q couple weeks"  . Drug Use: No  . Sexual Activity: Yes   Other Topics  Concern  . Not on file   Social History Narrative   Moved to [place] from [place] in 2015.      REVIEW OF SYSTEMS: Constitutional:  No weight loss or weakness ENT:  No nose bleeds Pulm:  No SOB or cough CV:  No palpitations, no LE edema.  GU:  No hematuria, no frequency GI:  Per HPI Heme:  No issues with unusual bleeding or bruising   Transfusions:  None  Neuro:  No headaches, no peripheral tingling or numbness Derm:  No itching, no rash or sores.  Endocrine:  No sweats or chills.  No polyuria or dysuria Immunization:  Not queried Travel:  None beyond local counties in last few months.    PHYSICAL EXAM: Vital signs in last 24 hours: Filed Vitals:   12/15/14 1033  BP: 116/69  Pulse: 72  Temp: 98.3 F (36.8 C)  Resp: 16   Wt Readings from Last 3 Encounters:  12/15/14 190 lb (86.183 kg)  11/30/14 193 lb 3.2 oz (87.635 kg)    General: pleasant, comfortable, looks well Head:  No swelling, no trauma, no assymmetry  Eyes:  No icterus or pallor Ears:  Not HOH  Nose:  No congestion or discharge Mouth:  Clear and moist MM.  Good dentition.  Neck:  No mass, no TMG, no JVS Lungs:  Clear bil.  No dyspnea or cough.  Heart: RRR.  No mrg Abdomen:  Soft, epig tenderness without guarding or rebound.  No mass, surgical incisions C/D/I.Marland Kitchen   Rectal: deferred   Musc/Skeltl: no joint swelling or deformity Extremities:  No CCE  Neurologic:  Oriented x 3, not drowsy.  No tremor.  Full limb strength. Skin:  No rash, sores or vascular ectasia Tattoos:  None seen Nodes:  No cervical adenopathy    Psych:  Pleasant, relaxed, cooperative.    LAB RESULTS:  Recent Labs  12/15/14 0715  WBC 5.5  HGB 11.7*  HCT 36.4  PLT 352   BMET Lab Results  Component Value Date   NA 138 12/15/2014   NA 138 12/03/2014   NA 140 12/02/2014   K 4.0 12/15/2014   K 3.8 12/03/2014   K 3.8 12/02/2014   CL 103 12/15/2014   CL 107 12/03/2014   CL 108 12/02/2014   CO2 25 12/15/2014   CO2 23  12/03/2014   CO2 26 12/02/2014   GLUCOSE 124* 12/15/2014   GLUCOSE 148* 12/03/2014   GLUCOSE 129* 12/02/2014   BUN 9 12/15/2014   BUN <5* 12/03/2014   BUN <5* 12/02/2014   CREATININE 0.71 12/15/2014  CREATININE 0.75 12/03/2014   CREATININE 0.78 12/02/2014   CALCIUM 9.1 12/15/2014   CALCIUM 8.3* 12/03/2014   CALCIUM 8.5 12/02/2014   LFT  Recent Labs  12/15/14 0715  PROT 7.6  ALBUMIN 3.8  AST 594*  ALT 335*  ALKPHOS 103  BILITOT 0.5   Lipase     Component Value Date/Time   LIPASE 31 12/15/2014 0715    RADIOLOGY STUDIES: Ct Abdomen Pelvis W Contrast  12/15/2014   CLINICAL DATA:  34 year old female status post cholecystectomy twelve days ago with abdominal pain. Initial encounter.  EXAM: CT ABDOMEN AND PELVIS WITH CONTRAST  TECHNIQUE: Multidetector CT imaging of the abdomen and pelvis was performed using the standard protocol following bolus administration of intravenous contrast.  CONTRAST:  48m OMNIPAQUE IOHEXOL 300 MG/ML SOLN, 106mOMNIPAQUE IOHEXOL 300 MG/ML SOLN  COMPARISON:  Preoperative abdomen MRI 12/01/2014, CT Abdomen and Pelvis 11/30/2014.  FINDINGS: Respiratory motion artifact at the lung bases. No pericardial or pleural effusion.  No acute osseous abnormality identified. L5-S1 disc degeneration. Chronic partially ununited posterior left inferior pubic ramus fracture.  No definite pelvic free fluid. Right adnexal cyst has regressed. Negative rectum with mild retained stool. Unremarkable bladder.  Retained stool in the sigmoid and more proximal colon. Occasional diverticula. Negative hepatic flexure. Normal retrocecal appendix.  No dilated small bowel. The stomach is distended with oral contrast. The duodenum is largely decompressed.  The gallbladder now is surgically absent. Surgical clips and minimal stranding in the gallbladder fossa. No intrahepatic biliary ductal dilatation. The CBD appears stable to mildly smaller. Liver enhancement is within normal limits.  Spleen,  pancreas, adrenal glands, and kidneys are within normal limits. Major arterial structures are patent and appear normal. No abdominal free fluid or free air identified. Early portal venous phase contrast enhancement is within normal limits.  Mild postoperative changes to the ventral abdominal wall. Several port sites are identified with no fluid collection or adverse features identified. Subjacent to the ventral peritoneal lining at the emboli kiss small bowel mesh is again suspected and stable.  IMPRESSION: 1. No adverse features identified status post recent cholecystectomy. 2. No new abnormality in the abdomen or pelvis.   Electronically Signed   By: H Genevie Ann.D.   On: 12/15/2014 08:24    ENDOSCOPIC STUDIES: None ever  IMPRESSION:   *  Recurrent epigastric pain and rising LFTs in pt s/p lap chole 3/18 for biliary pancreatitis.  Preop transaminases significantly elevated, above what is normal for obstructing choledocholithiasis.  On preop CT the CBD was dilated but no evidence of choledocholithiasis.  Subsequent, preop MRCP with 5 mm CBD and no CBD stones or obstruction.  [facility] met resistance, raising question of an obstructing stone.  CT now with stable to mildly improved CBD diameter.  No evidence of recurrent pancreatitis.     PLAN:     *  Per Dr GeCarlean Purl*  I set pt up for ERCP tomorrow (noon) in case this is Dr GeCelesta Averecision. Pt agreeable if that is the decision.    SaAzucena Freed3/31/2016, 12:58 PM Pager: [phone] Attending  I have also seen and assessed the patient and agree with the advanced practitioner's assessment and plan. I have reviewed images (CT, MRCP) Her story sounds like a bile duct stone though imaging and pattern of abnl LFT's do not support. We discussed repeating MRCP but agreed that a negative MRCP would not stop work-up. ERCP has therapeutic potential and is probably more  definitive diagnostic exam as well.  ERCP tomorrow -  possible sphincterotomy, stone extraction. The risks and benefits as well as alternatives of endoscopic procedure(s) have been discussed and reviewed. All questions answered. The patient agrees to proceed.  Gatha Mayer, MD, [facility] [phone] (pager) 12/15/2014 5:36 PM

## 2014-12-15 NOTE — ED Provider Notes (Signed)
CSN: [id number]     Arrival date & time 12/15/14  4098 History   First MD Initiated Contact with Patient 12/15/14 0701     Chief Complaint  Patient presents with  . Abdominal Pain     (Consider location/radiation/quality/duration/timing/severity/associated sxs/prior Treatment) HPI  Pt presenting with c/o epigastric pain.  She had choleycystectomy 3/19 without complications due to gallstone pancreatitis.  She had been feeling well until pain began last night.  Last meal was pizza at 4pm, then pain began approx 2am.  Pain is constant and cramping.  Radiates through to her back.  No nausea or vomiting.  No fever/chills.  She has not had any treatment prior to arrival.  There are no other associated systemic symptoms, there are no other alleviating or modifying factors.   Past Medical History  Diagnosis Date  . Acute pancreatitis 11/30/2014   Past Surgical History  Procedure Laterality Date  . Cesarean section  04/2003; 01/2009  . Hernia repair  2005  . Abdominal hernia repair  2005  . Cholecystectomy N/A 12/02/2014    Procedure: LAPAROSCOPIC CHOLECYSTECTOMY WITH ATTEMPTED INTRAOPERATIVE CHOLANGIOGRAM;  Surgeon: Abigail Miyamoto, MD;  Location: [facility];  Service: General;  Laterality: N/A;   Family History  Problem Relation Age of Onset  . Stroke Mother   . Stroke Father   . Kidney cancer Neg Hx   . Alcoholism Neg Hx   . Liver cancer Maternal Uncle    History  Substance Use Topics  . Smoking status: Never Smoker   . Smokeless tobacco: Never Used  . Alcohol Use: Yes     Comment: 11/30/2014 "glass of wine q couple weeks"   OB History    No data available     Review of Systems  ROS reviewed and all otherwise negative except for mentioned in HPI    Allergies  Review of patient's allergies indicates no known allergies.  Home Medications   Prior to Admission medications   Medication Sig Start Date End Date Taking? Authorizing Provider  oxyCODONE-acetaminophen (PERCOCET/ROXICET)  5-325 MG per tablet Take 1-2 tablets by mouth every 4 (four) hours as needed for moderate pain or severe pain. Patient not taking: Reported on 12/15/2014 12/03/14   Pincus Large, DO   BP 118/69 mmHg  Pulse 72  Temp(Src) 97.9 F (36.6 C) (Oral)  Resp 15  Ht  (1.626 m)  Wt 193 lb 1.6 oz (87.59 kg)  BMI 33.13 kg/m2  SpO2 100%  LMP 11/24/2014  Vitals reviewed Physical Exam  Physical Examination: General appearance - alert, well appearing, and in no distress Mental status - alert, oriented to person, place, and time Eyes - no conjunctival injection, no scleral icterus Mouth - mucous membranes moist, pharynx normal without lesions Chest - clear to auscultation, no wheezes, rales or rhonchi, symmetric air entry Heart - normal rate, regular rhythm, normal S1, S2, no murmurs, rubs, clicks or gallops Abdomen - soft, epigastric tenderness to palpation, no gaurding or rebound tenderness, nondistended, no masses or organomegaly Extremities - peripheral pulses normal, no pedal edema, no clubbing or cyanosis Skin - normal coloration and turgor, no rashes  ED Course  Procedures (including critical care time)  9:27 AM d/w family practice resident, she requests d/w surgery first, paging now.   9:52 AM d/w surgery and they agree with medical admission- will re-call family medicine.   Labs Review Labs Reviewed  MRSA PCR SCREENING - Abnormal; Notable for the following:    MRSA by PCR POSITIVE (*)  All other components within normal limits  URINALYSIS, ROUTINE W REFLEX MICROSCOPIC - Abnormal; Notable for the following:    Color, Urine AMBER (*)    Hgb urine dipstick LARGE (*)    Leukocytes, UA TRACE (*)    All other components within normal limits  CBC - Abnormal; Notable for the following:    Hemoglobin 11.7 (*)    All other components within normal limits  COMPREHENSIVE METABOLIC PANEL - Abnormal; Notable for the following:    Glucose, Bld 124 (*)    AST 594 (*)    ALT 335 (*)     All other components within normal limits  URINE MICROSCOPIC-ADD ON - Abnormal; Notable for the following:    Squamous Epithelial / LPF FEW (*)    Bacteria, UA MANY (*)    All other components within normal limits  CBC WITH DIFFERENTIAL/PLATELET - Abnormal; Notable for the following:    WBC 3.3 (*)    Hemoglobin 11.3 (*)    Neutrophils Relative % 38 (*)    Neutro Abs 1.3 (*)    Monocytes Relative 13 (*)    Eosinophils Relative 9 (*)    All other components within normal limits  COMPREHENSIVE METABOLIC PANEL - Abnormal; Notable for the following:    Glucose, Bld 113 (*)    BUN <5 (*)    Albumin 3.4 (*)    AST 962 (*)    ALT 1029 (*)    Alkaline Phosphatase 162 (*)    Total Bilirubin 2.3 (*)    GFR calc non Af Amer 88 (*)    All other components within normal limits  PREGNANCY, URINE  LIPASE, BLOOD  PROTIME-INR  CBC  URINALYSIS, ROUTINE W REFLEX MICROSCOPIC    Imaging Review Ct Abdomen Pelvis W Contrast  12/15/2014   CLINICAL DATA:  34 year old female status post cholecystectomy twelve days ago with abdominal pain. Initial encounter.  EXAM: CT ABDOMEN AND PELVIS WITH CONTRAST  TECHNIQUE: Multidetector CT imaging of the abdomen and pelvis was performed using the standard protocol following bolus administration of intravenous contrast.  CONTRAST:  50mL OMNIPAQUE IOHEXOL 300 MG/ML SOLN, 100mL OMNIPAQUE IOHEXOL 300 MG/ML SOLN  COMPARISON:  Preoperative abdomen MRI 12/01/2014, CT Abdomen and Pelvis 11/30/2014.  FINDINGS: Respiratory motion artifact at the lung bases. No pericardial or pleural effusion.  No acute osseous abnormality identified. L5-S1 disc degeneration. Chronic partially ununited posterior left inferior pubic ramus fracture.  No definite pelvic free fluid. Right adnexal cyst has regressed. Negative rectum with mild retained stool. Unremarkable bladder.  Retained stool in the sigmoid and more proximal colon. Occasional diverticula. Negative hepatic flexure. Normal retrocecal  appendix.  No dilated small bowel. The stomach is distended with oral contrast. The duodenum is largely decompressed.  The gallbladder now is surgically absent. Surgical clips and minimal stranding in the gallbladder fossa. No intrahepatic biliary ductal dilatation. The CBD appears stable to mildly smaller. Liver enhancement is within normal limits.  Spleen, pancreas, adrenal glands, and kidneys are within normal limits. Major arterial structures are patent and appear normal. No abdominal free fluid or free air identified. Early portal venous phase contrast enhancement is within normal limits.  Mild postoperative changes to the ventral abdominal wall. Several port sites are identified with no fluid collection or adverse features identified. Subjacent to the ventral peritoneal lining at the emboli kiss small bowel mesh is again suspected and stable.  IMPRESSION: 1. No adverse features identified status post recent cholecystectomy. 2. No new abnormality in the abdomen or  pelvis.   Electronically Signed   By: Odessa Fleming M.D.   On: 12/15/2014 08:24   Dg Ercp Biliary & Pancreatic Ducts  12/16/2014   CLINICAL DATA:  ERCP with sphincterotomy and stone removal.  EXAM: ERCP  TECHNIQUE: Multiple spot images obtained with the fluoroscopic device and submitted for interpretation post-procedure.  COMPARISON:  CT 12/15/2014  FINDINGS: Opacification of the biliary system. At least 2 small filling defects in distal common bile duct are compatible with stones. Catheter was advanced into the common bile duct. There is oral contrast in the colon and appendix.  IMPRESSION: Filling defects in the distal common bile duct are compatible with stones.  These images were submitted for radiologic interpretation only. Please see the procedural report for the amount of contrast and the fluoroscopy time utilized.   Electronically Signed   By: Richarda Overlie M.D.   On: 12/16/2014 14:59     EKG Interpretation None      MDM  Elevated  LFTS epigastric pain S/p cholecystectomy   Pt POD 13 from lap chole with recurrence of abdominal pain, LFTS are elevated, d/w surgery and they recommend medical admission. Pt's pain under good control after morphine.  CT scan does not show fluid collection or any other acute abnormalities.  Pt agreeeable with plan for admission.  Pt admitted to family practice team.  Plan for GI consult.      Jerelyn Scott, MD 12/16/14 [phone]

## 2014-12-15 NOTE — H&P (Signed)
Family Medicine Teaching [facility] Admission History and Physical Service Pager: [phone]  Patient name: Sylvia Salinas Medical record number: [id number] Date of birth: 12-04-1980 Age: 34 y.o. Gender: female  Primary Care Provider: No PCP Per Patient Consultants: Surgery, GI Code Status: Full Code  Chief Complaint: Abdominal pain   Assessment and Plan: Sylvia Salinas is a 34 y.o. female presenting with abdominal pain and elevated LFTs s/p cholecystectomy for gallstone pancreatitis 2 weeks ago. PMH is significant for recent gallstone pancreatitis and cholecystectomy.  Abdominal pain:  s/p cholecystectomy for gallstone Pancreatitis/CBD dilation 2 weeks ago. Pt reports doing well after surgery until last night. Her surgery f/u was scheduled for next week.  - NPO- Until consults make recommendations in the event she will go to surgery  - MIVF:  NS @ 131mL/hr - pain control with morphine and zofran prn nausea. Can de-escalate pain meds as tolerating PO.  - Repeat CMP in am - Surgery aware of patient and asked for admission - GI consulted for possible repeat ERCP and recommendations - Pain control with morphine, will refrain from NSAID with RBC in urine after high dose Advil ( ).  Hematuria: Patient is asymptomatic, not currently on her menstrual cycle. Did take large dose NSAID. Creatinine is normal.  - will hold nephrotoxic medications for now - Repeat UA in am, will send culture if needed - BMP in am  FEN/GI: NPO until consults are completed, MIVF Prophylaxis: Hep SQ  Disposition: Pending surgery and GI recommnedations  History of Present Illness: Sylvia Salinas is a 35 y.o. female presenting with epigastric abdominal pain that started last night at 2 am. Te pain had awakened her form her sleep. She had pizza at 4 pm, but states she has eaten pizza since her surgery without difficulty. She has had no pain or problems since being discharged home 2 weeks ago  after her cholecystectomy for GB pancreatitis. She had taken 1600 mg of advil for the pain with some resolution, however the pain returned and she went to the ED. She admits to drinking "some" on Saturday, but only 1-2 drinks. She denies fever, nausea, vomit, diarrhea, dysuria, urinary frequency or suprapubic pain.   Review Of Systems: Per HPI with the following additions: None Otherwise 12 point review of systems was performed and was unremarkable.  Patient Active Problem List   Diagnosis Date Noted  . Elevated liver enzymes 12/15/2014  . Gallstones   . Acute pancreatitis   . Common bile duct dilation   . Nausea with vomiting   . Pancreatitis 11/30/2014   Past Medical History: Past Medical History  Diagnosis Date  . Acute pancreatitis 11/30/2014   Past Surgical History: Past Surgical History  Procedure Laterality Date  . Cesarean section  04/2003; 01/2009  . Hernia repair  2005  . Abdominal hernia repair  2005  . Cholecystectomy N/A 12/02/2014    Procedure: LAPAROSCOPIC CHOLECYSTECTOMY WITH ATTEMPTED INTRAOPERATIVE CHOLANGIOGRAM;  Surgeon: Abigail Miyamoto, MD;  Location: [facility] OR;  Service: General;  Laterality: N/A;   Social History: History  Substance Use Topics  . Smoking status: Never Smoker   . Smokeless tobacco: Never Used  . Alcohol Use: Yes     Comment: 11/30/2014 "glass of wine q couple weeks"   Additional social history: per HPI Please also refer to relevant sections of EMR.  Family History: Family History  Problem Relation Age of Onset  . Stroke Mother   . Stroke Father   . Kidney cancer Neg Hx   . Alcoholism Neg  Hx   . Liver cancer Maternal Uncle    Allergies and Medications: No Known Allergies No current facility-administered medications on file prior to encounter.   Current Outpatient Prescriptions on File Prior to Encounter  Medication Sig Dispense Refill  . acetaminophen (TYLENOL) 500 MG tablet Take 500 mg by mouth every 8 (eight) hours as needed  (pain).    . Norethindrone-Ethinyl Estradiol-Fe Biphas (LO LOESTRIN FE) 1 MG-10 MCG / 10 MCG tablet Take 1 tablet by mouth at bedtime.    Marland Kitchen oxyCODONE-acetaminophen (PERCOCET/ROXICET) 5-325 MG per tablet Take 1-2 tablets by mouth every 4 (four) hours as needed for moderate pain or severe pain. 30 tablet 0    Objective: BP 116/69 mmHg  Pulse 72  Temp(Src) 98.3 F (36.8 C) (Oral)  Resp 16  Ht  (1.626 m)  Wt 190 lb (86.183 kg)  BMI 32.60 kg/m2  SpO2 99%  LMP 11/24/2014 Exam: Gen: NAD. Non-toxic in appearance. Well developed, well nourished, pleasant AAF. Mildly obese.  HEENT: AT. [place]. Bilateral eyes without injections or icterus. MMM.  CV: RRR, no murmur appraciated Chest: CTAB, no wheeze or crackles Abd: Soft. Mildly obese. NTND. BS present. No Masses palpated. Well healing umbilical and port scars.  Ext: No erythema. No edema. +2/4 PT Skin: No rashes, purpura or petechiae.  Neuro: Normal gait. PERLA. EOMi. Alert and oriented  Labs and Imaging: CBC BMET   Recent Labs Lab 12/15/14 0715  WBC 5.5  HGB 11.7*  HCT 36.4  PLT 352    Recent Labs Lab 12/15/14 0715  NA 138  K 4.0  CL 103  CO2 25  BUN 9  CREATININE 0.71  GLUCOSE 124*  CALCIUM 9.1     Urinalysis    Component Value Date/Time   COLORURINE AMBER* 12/15/2014 0720   APPEARANCEUR CLEAR 12/15/2014 0720   LABSPEC 1.024 12/15/2014 0720   PHURINE 7.0 12/15/2014 0720   GLUCOSEU NEGATIVE 12/15/2014 0720   HGBUR LARGE* 12/15/2014 0720   BILIRUBINUR NEGATIVE 12/15/2014 0720   KETONESUR NEGATIVE 12/15/2014 0720   PROTEINUR NEGATIVE 12/15/2014 0720   UROBILINOGEN 1.0 12/15/2014 0720   NITRITE NEGATIVE 12/15/2014 0720   LEUKOCYTESUR TRACE* 12/15/2014 0720   Lipase     Component Value Date/Time   LIPASE 31 12/15/2014 0715     Ct Abdomen Pelvis W Contrast  12/15/2014   CLINICAL DATA:  34 year old female status post cholecystectomy twelve days ago with abdominal pain. Initial encounter.  EXAM: CT ABDOMEN  AND PELVIS WITH CONTRAST  TECHNIQUE: Multidetector CT imaging of the abdomen and pelvis was performed using the standard protocol following bolus administration of intravenous contrast.  CONTRAST:  50mL OMNIPAQUE IOHEXOL 300 MG/ML SOLN, OMNIPAQUE IOHEXOL 300 MG/ML SOLN  COMPARISON:  Preoperative abdomen MRI 12/01/2014, CT Abdomen and Pelvis 11/30/2014.  FINDINGS: Respiratory motion artifact at the lung bases. No pericardial or pleural effusion.  No acute osseous abnormality identified. L5-S1 disc degeneration. Chronic partially ununited posterior left inferior pubic ramus fracture.  No definite pelvic free fluid. Right adnexal cyst has regressed. Negative rectum with mild retained stool. Unremarkable bladder.  Retained stool in the sigmoid and more proximal colon. Occasional diverticula. Negative hepatic flexure. Normal retrocecal appendix.  No dilated small bowel. The stomach is distended with oral contrast. The duodenum is largely decompressed.  The gallbladder now is surgically absent. Surgical clips and minimal stranding in the gallbladder fossa. No intrahepatic biliary ductal dilatation. The CBD appears stable to mildly smaller. Liver enhancement is within normal limits.  Spleen, pancreas, adrenal  glands, and kidneys are within normal limits. Major arterial structures are patent and appear normal. No abdominal free fluid or free air identified. Early portal venous phase contrast enhancement is within normal limits.  Mild postoperative changes to the ventral abdominal wall. Several port sites are identified with no fluid collection or adverse features identified. Subjacent to the ventral peritoneal lining at the emboli kiss small bowel mesh is again suspected and stable.  IMPRESSION: 1. No adverse features identified status post recent cholecystectomy. 2. No new abnormality in the abdomen or pelvis.   Electronically Signed   By: Odessa FlemingH  Hall M.D.   On: 12/15/2014 08:24    Natalia LeatherwoodRenee A Kuneff, DO 12/15/2014, 11:40  AM PGY-3, [facility] [facility] FPTS Intern pager: [phone], text pages welcome

## 2014-12-15 NOTE — ED Notes (Signed)
Patient transported to CT 

## 2014-12-15 NOTE — ED Notes (Signed)
MD at bedside discussing plan of care for admission.  

## 2014-12-15 NOTE — ED Notes (Signed)
[facility] here at this time. Report called to Lynden Angathy, [facility]raiserN [facility].

## 2014-12-15 NOTE — Anesthesia Preprocedure Evaluation (Addendum)
Anesthesia Evaluation  Patient identified by MRN, date of birth, ID band Patient awake    Reviewed: Allergy & Precautions, NPO status , Patient's Chart, lab work & pertinent test results  History of Anesthesia Complications Negative for: history of anesthetic complications  Airway Mallampati: II  TM Distance: >3 FB Neck ROM: Full    Dental no notable dental hx. (+) Dental Advisory Given   Pulmonary neg pulmonary ROS,  breath sounds clear to auscultation  Pulmonary exam normal       Cardiovascular negative cardio ROS  Rhythm:Regular Rate:Normal     Neuro/Psych negative neurological ROS  negative psych ROS   GI/Hepatic negative GI ROS, Neg liver ROS,   Endo/Other  obesity  Renal/GU negative Renal ROS  negative genitourinary   Musculoskeletal negative musculoskeletal ROS (+)   Abdominal   Peds negative pediatric ROS (+)  Hematology negative hematology ROS (+)   Anesthesia Other Findings   Reproductive/Obstetrics negative OB ROS                            Anesthesia Physical Anesthesia Plan  ASA: II  Anesthesia Plan: MAC   Post-op Pain Management:    Induction: Intravenous  Airway Management Planned: Nasal Cannula  Additional Equipment:   Intra-op Plan:   Post-operative Plan:   Informed Consent: I have reviewed the patients History and Physical, chart, labs and discussed the procedure including the risks, benefits and alternatives for the proposed anesthesia with the patient or authorized representative who has indicated his/her understanding and acceptance.   Dental advisory given  Plan Discussed with: CRNA  Anesthesia Plan Comments:         Anesthesia Quick Evaluation

## 2014-12-15 NOTE — ED Notes (Signed)
Belongings: purse, hate, pants, shoes, shirt, jacket, cell phone, 1 gray ring

## 2014-12-15 NOTE — Progress Notes (Signed)
Patient ID: Sylvia Salinas, female   DOB: 04/19/1981, 34 y.o.   MRN: [id number]    Subjective: Pt known to [place] from recent cholecystectomy after having mild gallstone pancreatitis on 12-02-14.  Her case was a little odd in that her AST and ALT were in the 1500s and 1900s with a lipase of only 300.  Her TB was initially elevated at 2.6.  This did normalize.  She underwent an MRCP which was negative for a CBD stone and a hepatitis panel that was negative as well.  She then underwent a lap chole but an IOC was unable to be obtained.  Her LFTs trended down after surgery but did not normalize prior to discharge.  Her AST was 232 and ALT 677 on 12-03-14.  She went home and was doing great until last night.  She awoke at 0200am this morning with epigastric abdominal pain that was a 10/10.  It was crampy in nature and felt very similar to the pain a couple of weeks ago.  She denies nausea or vomiting.  No fevers.  Upon [facility] to [facility], she has a CT scan that was negative with decreased size of CBD from recent imaging, no fluid in the gallbladder fossa, or any other indications of a surgical complication.  Her AST and ALT were elevated though at 594 and 335.  Her TB and alk phos are normal.  [facility] and we were asked to see her.  Objective: Vital signs in last 24 hours: Temp:  [97.9 F (36.6 C)-98.3 F (36.8 C)] 97.9 F (36.6 C) (03/31 1300) Pulse Rate:  [72-85] 73 (03/31 1300) Resp:  [16-18] 18 (03/31 1300) BP: (115-140)/(62-91) 115/62 mmHg (03/31 1300) SpO2:  [98 %-99 %] 99 % (03/31 1300) Weight:  [86.183 kg (190 lb)] 86.183 kg (190 lb) (03/31 0640)    Intake/Output from previous day:   Intake/Output this shift:    PE: Abd: soft, NT right now, but just got pain meds, +BS, ND, incisions are all well healed. Heart: regular Lungs: CTAB  Lab Results:   Recent Labs  12/15/14 0715  WBC 5.5  HGB 11.7*  HCT 36.4  PLT 352   BMET  Recent  Labs  12/15/14 0715  NA 138  K 4.0  CL 103  CO2 25  GLUCOSE 124*  BUN 9  CREATININE 0.71  CALCIUM 9.1   PT/INR No results for input(s): LABPROT, INR in the last 72 hours. CMP     Component Value Date/Time   NA 138 12/15/2014 0715   K 4.0 12/15/2014 0715   CL 103 12/15/2014 0715   CO2 25 12/15/2014 0715   GLUCOSE 124* 12/15/2014 0715   BUN 9 12/15/2014 0715   CREATININE 0.71 12/15/2014 0715   CALCIUM 9.1 12/15/2014 0715   PROT 7.6 12/15/2014 0715   ALBUMIN 3.8 12/15/2014 0715   AST 594* 12/15/2014 0715   ALT 335* 12/15/2014 0715   ALKPHOS 103 12/15/2014 0715   BILITOT 0.5 12/15/2014 0715   GFRNONAA >90 12/15/2014 0715   GFRAA >90 12/15/2014 0715   Lipase     Component Value Date/Time   LIPASE 31 12/15/2014 0715       Studies/Results: Ct Abdomen Pelvis W Contrast  12/15/2014   CLINICAL DATA:  34 year old female status post cholecystectomy twelve days ago with abdominal pain. Initial encounter.  EXAM: CT ABDOMEN AND PELVIS WITH CONTRAST  TECHNIQUE: Multidetector CT imaging of the abdomen and pelvis was performed using the  standard protocol following bolus administration of intravenous contrast.  CONTRAST:  61m OMNIPAQUE IOHEXOL 300 MG/ML SOLN, 1063mOMNIPAQUE IOHEXOL 300 MG/ML SOLN  COMPARISON:  Preoperative abdomen MRI 12/01/2014, CT Abdomen and Pelvis 11/30/2014.  FINDINGS: Respiratory motion artifact at the lung bases. No pericardial or pleural effusion.  No acute osseous abnormality identified. L5-S1 disc degeneration. Chronic partially ununited posterior left inferior pubic ramus fracture.  No definite pelvic free fluid. Right adnexal cyst has regressed. Negative rectum with mild retained stool. Unremarkable bladder.  Retained stool in the sigmoid and more proximal colon. Occasional diverticula. Negative hepatic flexure. Normal retrocecal appendix.  No dilated small bowel. The stomach is distended with oral contrast. The duodenum is largely decompressed.  The  gallbladder now is surgically absent. Surgical clips and minimal stranding in the gallbladder fossa. No intrahepatic biliary ductal dilatation. The CBD appears stable to mildly smaller. Liver enhancement is within normal limits.  Spleen, pancreas, adrenal glands, and kidneys are within normal limits. Major arterial structures are patent and appear normal. No abdominal free fluid or free air identified. Early portal venous phase contrast enhancement is within normal limits.  Mild postoperative changes to the ventral abdominal wall. Several port sites are identified with no fluid collection or adverse features identified. Subjacent to the ventral peritoneal lining at the emboli kiss small bowel mesh is again suspected and stable.  IMPRESSION: 1. No adverse features identified status post recent cholecystectomy. 2. No new abnormality in the abdomen or pelvis.   Electronically Signed   By: H Genevie Ann.D.   On: 12/15/2014 08:24    Anti-infectives: Anti-infectives    None       Assessment/Plan  1. POD 13 s/p lap chole for gallstone pancreatitis 2. Elevated LFTs 3. Epigastric abdominal pain  Plan: 1. There do not appear to be any surgical complications.  It is unclear what her elevated LFTs are from.  It's also difficult to tell if they are still somewhat elevated from her last admission, given we never saw labs return to normal, but clearly have been effected as her AST is higher than on discharge.  No surgical intervention planned.  We agree with GI evaluation.  Her MCRP was negative, but an IOC was unable to be done to confirm no presence of any stones.  Will defer further work up to GI.  The patient does not need to follow up with usKorean our office next week due to her visit today.  I have discussed this with her.  Please call usKoreaf needed.  We will sign off.   LOS: 0 days    Janeece Blok E 12/15/2014, 1:37 PM Pager: [phone]

## 2014-12-16 ENCOUNTER — Inpatient Hospital Stay (HOSPITAL_COMMUNITY): Payer: 59 | Admitting: Anesthesiology

## 2014-12-16 ENCOUNTER — Inpatient Hospital Stay (HOSPITAL_COMMUNITY): Payer: 59

## 2014-12-16 ENCOUNTER — Encounter (HOSPITAL_COMMUNITY): Payer: Self-pay | Admitting: *Deleted

## 2014-12-16 ENCOUNTER — Encounter (HOSPITAL_COMMUNITY)
Admission: EM | Disposition: A | Discharge status: Home / Self Care | Payer: Self-pay | Source: Home / Self Care | Attending: Family Medicine

## 2014-12-16 DIAGNOSIS — K838 Other specified diseases of biliary tract: Secondary | ICD-10-CM

## 2014-12-16 DIAGNOSIS — K8051 Calculus of bile duct without cholangitis or cholecystitis with obstruction: Secondary | ICD-10-CM | POA: Diagnosis present

## 2014-12-16 HISTORY — PX: ERCP: SHX5425

## 2014-12-16 LAB — CBC WITH DIFFERENTIAL/PLATELET
Basophils Absolute: 0 10*3/uL (ref 0.0–0.1)
Basophils Relative: 0 % (ref 0–1)
EOS PCT: 9 % — AB (ref 0–5)
Eosinophils Absolute: 0.3 10*3/uL (ref 0.0–0.7)
HCT: 36.6 % (ref 36.0–46.0)
Hemoglobin: 11.3 g/dL — ABNORMAL LOW (ref 12.0–15.0)
LYMPHS ABS: 1.3 10*3/uL (ref 0.7–4.0)
LYMPHS PCT: 40 % (ref 12–46)
MCH: 26.5 pg (ref 26.0–34.0)
MCHC: 30.9 g/dL (ref 30.0–36.0)
MCV: 85.9 fL (ref 78.0–100.0)
MONO ABS: 0.4 10*3/uL (ref 0.1–1.0)
Monocytes Relative: 13 % — ABNORMAL HIGH (ref 3–12)
Neutro Abs: 1.3 10*3/uL — ABNORMAL LOW (ref 1.7–7.7)
Neutrophils Relative %: 38 % — ABNORMAL LOW (ref 43–77)
Platelets: 356 10*3/uL (ref 150–400)
RBC: 4.26 MIL/uL (ref 3.87–5.11)
RDW: 13.5 % (ref 11.5–15.5)
WBC: 3.3 10*3/uL — AB (ref 4.0–10.5)

## 2014-12-16 LAB — COMPREHENSIVE METABOLIC PANEL
ALBUMIN: 3.4 g/dL — AB (ref 3.5–5.2)
ALT: 1029 U/L — ABNORMAL HIGH (ref 0–35)
ANION GAP: 5 (ref 5–15)
AST: 962 U/L — ABNORMAL HIGH (ref 0–37)
Alkaline Phosphatase: 162 U/L — ABNORMAL HIGH (ref 39–117)
BILIRUBIN TOTAL: 2.3 mg/dL — AB (ref 0.3–1.2)
CALCIUM: 8.7 mg/dL (ref 8.4–10.5)
CO2: 28 mmol/L (ref 19–32)
CREATININE: 0.86 mg/dL (ref 0.50–1.10)
Chloride: 105 mmol/L (ref 96–112)
GFR calc Af Amer: >90 mL/min (ref >90)
GFR calc non Af Amer: 88 mL/min — ABNORMAL LOW (ref >90)
Glucose, Bld: 113 mg/dL — ABNORMAL HIGH (ref 70–99)
Potassium: 4.1 mmol/L (ref 3.5–5.1)
SODIUM: 138 mmol/L (ref 135–145)
Total Protein: 7 g/dL (ref 6.0–8.3)

## 2014-12-16 LAB — URINALYSIS, ROUTINE W REFLEX MICROSCOPIC
BILIRUBIN URINE: NEGATIVE
Glucose, UA: NEGATIVE mg/dL
KETONES UR: 40 mg/dL — AB
Leukocytes, UA: NEGATIVE
Nitrite: NEGATIVE
PROTEIN: NEGATIVE mg/dL
SPECIFIC GRAVITY, URINE: 1.007 (ref 1.005–1.030)
Urobilinogen, UA: 1 mg/dL (ref 0.0–1.0)
pH: 6.5 (ref 5.0–8.0)

## 2014-12-16 LAB — URINE MICROSCOPIC-ADD ON

## 2014-12-16 LAB — PROTIME-INR
INR: 1.05 (ref 0.00–1.49)
Prothrombin Time: 13.9 seconds (ref 11.6–15.2)

## 2014-12-16 LAB — MRSA PCR SCREENING: MRSA by PCR: POSITIVE — AB

## 2014-12-16 SURGERY — ERCP, WITH INTERVENTION IF INDICATED
Anesthesia: Monitor Anesthesia Care

## 2014-12-16 MED ORDER — LIDOCAINE HCL (CARDIAC) 20 MG/ML IV SOLN
INTRAVENOUS | Status: DC | PRN
  Administered 2014-12-16: 50 mg via INTRAVENOUS

## 2014-12-16 MED ORDER — FENTANYL CITRATE 0.05 MG/ML IJ SOLN: 25.0000 ug | INTRAMUSCULAR | Status: DC | PRN

## 2014-12-16 MED ORDER — SODIUM CHLORIDE 0.9 % IV SOLN
INTRAVENOUS | Status: DC | PRN
  Administered 2014-12-16: 15:00:00

## 2014-12-16 MED ORDER — SODIUM CHLORIDE 0.9 % IV SOLN: INTRAVENOUS | Status: DC

## 2014-12-16 MED ORDER — INDOMETHACIN 50 MG RE SUPP
100.0000 mg | Freq: Once | RECTAL | Status: AC
  Administered 2014-12-16: 100 mg via RECTAL
  Filled 2014-12-16 (×2): qty 2

## 2014-12-16 MED ORDER — SODIUM CHLORIDE 0.9 % IV SOLN
1.5000 g | Freq: Once | INTRAVENOUS | Status: AC
  Administered 2014-12-16: 1.5 g via INTRAVENOUS
  Filled 2014-12-16: qty 1.5

## 2014-12-16 MED ORDER — LACTATED RINGERS IV SOLN
INTRAVENOUS | Status: DC | PRN
  Administered 2014-12-16: 14:00:00 via INTRAVENOUS

## 2014-12-16 MED ORDER — FENTANYL CITRATE 0.05 MG/ML IJ SOLN
INTRAMUSCULAR | Status: DC | PRN
  Administered 2014-12-16 (×5): 50 ug via INTRAVENOUS

## 2014-12-16 MED ORDER — INDOMETHACIN 50 MG RE SUPP
100.0000 mg | Freq: Once | RECTAL | Status: AC
  Administered 2014-12-16: 100 mg via RECTAL
  Filled 2014-12-16: qty 2

## 2014-12-16 MED ORDER — MUPIROCIN 2 % EX OINT
1.0000 "application " | TOPICAL_OINTMENT | Freq: Two times a day (BID) | CUTANEOUS | Status: DC
  Administered 2014-12-16 (×2): 1 via NASAL
  Filled 2014-12-16: qty 22

## 2014-12-16 MED ORDER — MIDAZOLAM HCL 5 MG/5ML IJ SOLN
INTRAMUSCULAR | Status: DC | PRN
  Administered 2014-12-16 (×2): 1 mg via INTRAVENOUS

## 2014-12-16 MED ORDER — CHLORHEXIDINE GLUCONATE CLOTH 2 % EX PADS
6.0000 | MEDICATED_PAD | Freq: Every day | CUTANEOUS | Status: DC
  Administered 2014-12-16 – 2014-12-17 (×2): 6 via TOPICAL

## 2014-12-16 MED ORDER — ONDANSETRON HCL 4 MG/2ML IJ SOLN: 4.0000 mg | Freq: Once | INTRAMUSCULAR | Status: AC | PRN

## 2014-12-16 MED ORDER — PROPOFOL 10 MG/ML IV BOLUS
INTRAVENOUS | Status: DC | PRN
  Administered 2014-12-16: 20 mg via INTRAVENOUS
  Administered 2014-12-16: 150 mg via INTRAVENOUS
  Administered 2014-12-16: 50 mg via INTRAVENOUS

## 2014-12-16 MED ORDER — ONDANSETRON HCL 4 MG/2ML IJ SOLN
INTRAMUSCULAR | Status: DC | PRN
  Administered 2014-12-16: 4 mg via INTRAVENOUS

## 2014-12-16 MED ORDER — SUCCINYLCHOLINE CHLORIDE 20 MG/ML IJ SOLN
INTRAMUSCULAR | Status: DC | PRN
  Administered 2014-12-16: 100 mg via INTRAVENOUS

## 2014-12-16 MED ORDER — GLUCAGON HCL RDNA (DIAGNOSTIC) 1 MG IJ SOLR
INTRAMUSCULAR | Status: AC
  Filled 2014-12-16: qty 1

## 2014-12-16 NOTE — Op Note (Signed)
[facility] [address] [place], [id number]   ERCP PROCEDURE REPORT        EXAM DATE: 12/16/2014  PATIENT NAME:          Sylvia Salinas, Sylvia Salinas          MR #: [id number] BIRTHDATE:       1980-10-24     VISIT #:     [id number] ATTENDING:     Iva Booparl E Etter Royall, MD, Clementeen GrahamFACG     STATUS:     inpatient  ASSISTANT:      Harrington ChallengerParker, Hope and CookGoldsmith, Idahoutumn INDICATIONS:  The patient is a 34 yr old female here for an ERCP due to PROCEDURE PERFORMED:     ERCP with sphincterotomy/papillotomy ERCP with removal of calculus/calculi MEDICATIONS:     Per Anesthesia CONSENT: The patient understands the risks and benefits of the procedure and understands that these risks include, but are not limited to: sedation, allergic reaction, infection, perforation and/or bleeding. Alternative means of evaluation and treatment include, among others: physical exam, x-rays, and/or surgical intervention. The patient elects to proceed with this endoscopic procedure.  DESCRIPTION OF PROCEDURE: During intra-op preparation period all mechanical & medical equipment was checked for proper function. Hand hygiene and appropriate measures for infection prevention was taken. After the risks, benefits and alternatives of the procedure were thoroughly explained, Informed was verified, confirmed and timeout was successfully executed by the treatment team. With the patient in left semi-prone position, medications were administered intravenously.The    was passed from the mouth into the esophagus and further advanced from the esophagus into the stomach. From stomach scope was directed to the second portion of the duodenum. Major papilla was aligned with the duodenoscope. The scope position was confirmed fluoroscopically. Rest of the findings/therapeutics are given below. The scope was then completely withdrawn from the patient and the procedure completed. The pulse, BP, and O2 saturation were  monitored and documented by the physician and the nursing staff throughout the entire procedure. The patient was cared for as planned according to standard protocol. The patient was then discharged to recovery in stable condition and with appropriate post procedure care.  1) Stone seen protruding from the ampulla - ball-valving. 2) Bile duct easily cannulated with wire and sphincterotomy performed.  2 stones seen in duct - one spontaneously passed and then the other removed with balloon.  balloon occlusion cholangiogram negative. 3) Bile duct is normal caliber throughout and gallbladder is surgically absent. 4) No pancreatogram obtained by intent. 5) Otherwise normal stomach and duodenum - esophagus not seen well.     ADVERSE EVENT:     There were no complications. IMPRESSIONS:     1) Stone seen protruding from the ampulla - ball-valving. 2) Bile duct easily cannulated with wire and sphincterotomy performed.  2 stones seen in duct - one spontaneously passed and then the other removed with balloon.  balloon occlusion cholangiogram negative. 3) Bile duct is normal caliber throughout and gallbladder is surgically absent. 4) No pancreatogram obtained by intent  RECOMMENDATIONS:     1.  Observe overnight and home tomorrow if ok. 2.  F/U LFT's to NL as outpatient   ___________________________________ Iva Booparl E Elliyah Liszewski, MD, Clementeen GrahamFACG eSigned:  Iva Booparl E Adelaido Nicklaus, MD, [facility] Center[facility]AC[facility] 12/16/2014 2:54 PM   CPT CODES:     1.  [id number] Endoscopic retrograde cholangiopancreatography (ERCP); w/ sphincterotomy/papillotomy 2.  [id number] Endoscopic retrograde cholangiopancreatography (ERCP); w/ endoscopic retrograde removal of calculus/calculi from biliary and/or pancreatic ducts

## 2014-12-16 NOTE — Anesthesia Procedure Notes (Signed)
Procedure Name: Intubation Date/Time: 12/16/2014 2:22 PM Performed by: Donette LarryMEYER, Vu Liebman E Pre-anesthesia Checklist: Patient identified, Emergency Drugs available, Suction available, Timeout performed and Patient being monitored Patient Re-evaluated:Patient Re-evaluated prior to inductionOxygen Delivery Method: Circle system utilized Preoxygenation: Pre-oxygenation with 100% oxygen Intubation Type: IV induction Ventilation: Mask ventilation without difficulty Laryngoscope Size: Miller and 2 Grade View: Grade I Tube type: Oral Tube size: 7.5 mm Number of attempts: 1 Airway Equipment and Method: Stylet Placement Confirmation: ETT inserted through vocal cords under direct vision Secured at: 22 cm Tube secured with: Tape Dental Injury: Teeth and Oropharynx as per pre-operative assessment

## 2014-12-16 NOTE — Transfer of Care (Signed)
Immediate Anesthesia Transfer of Care Note  Patient: Sylvia Salinas  Procedure(s) Performed: Procedure(s): ENDOSCOPIC RETROGRADE CHOLANGIOPANCREATOGRAPHY (ERCP) (N/A)  Patient Location: PACU  Anesthesia Type:General  Level of Consciousness: awake  Airway & Oxygen Therapy: Patient Spontanous Breathing and Patient connected to nasal cannula oxygen  Post-op Assessment: Report given to RN and Post -op Vital signs reviewed and stable  Post vital signs: Reviewed and stable  Last Vitals:  Filed Vitals:   12/16/14 1232  BP:   Pulse:   Temp: 36.5 C  Resp: 19    Complications: No apparent anesthesia complications

## 2014-12-16 NOTE — Progress Notes (Signed)
FPTS Interim Progress Note  S: Eigastric pain overnight managed well with IV morphine. Nausea controlled with zofran. Just had morphine 15 mins prior to exam, so pain improved  O: BP 98/55 mmHg  Pulse 72  Temp(Src) 97.9 F (36.6 C) (Oral)  Resp 16  Ht 5\' 4"  (1.626 m)  Wt 87.59 kg (193 lb 1.6 oz)  BMI 33.13 kg/m2  SpO2 98%  LMP 11/24/2014  Gen: NAD. Non-toxic in appearance. Well developed, well nourished, pleasant AAF. Mildly obese.  HEENT: AT. [place]. Bilateral eyes without injections or icterus. MMM.  CV: RRR, no murmur appraciated Chest: CTAB, no wheeze or crackles Abd: Soft. Mildly obese. NTND. BS present. No Masses palpated. Well healing umbilical and port scars. Mild epigastric tenderness on exam, no R/G Ext: No erythema. No edema..  Neuro:  Alert and oriented  AST/ALT 962/1029 from 594/335 3/31  CT abd/pelvis 3/31  IMPRESSION: 1. No adverse features identified status post recent cholecystectomy. 2. No new abnormality in the abdomen or pelvis.  A/P: Sylvia Salinas is a 34 y.o. female presenting with abdominal pain and elevated LFTs s/p cholecystectomy for gallstone pancreatitis 2 weeks ago. Increasing LFTS. Stable   Abdominal pain: s/p cholecystectomy for gallstone Pancreatitis/CBD dilation 2 weeks ago. Pt reports doing well after surgery until last night. Her surgery f/u was scheduled for next week.  - NPO - ERCP today, if unsuccessful consider gen surg consult - MIVF: NS @ 16725mL/hr - pain control with morphine and zofran prn nausea.  - Pain control with morphine, will refrain from NSAID with RBC in urine after high dose Advil (1600mg ).  Hematuria:   - will hold nephrotoxic medications for now - F/u repeat UA this AM, will send culture if needed - Cr .86  FEN/GI: NPO until consults are completed, MIVF  Prophylaxis: Hep SQ   Sylvia A. Kennon RoundsHaney MD, MS Family Medicine Resident PGY-1 Pager [phone]

## 2014-12-16 NOTE — Progress Notes (Signed)
12/16/2014 1230 UR completed. Chart reviewed. Isidoro DonningAlesia Letticia Bhattacharyya RN CCM Case Mgmt phone [phone]

## 2014-12-16 NOTE — Discharge Summary (Signed)
[facility]  Patient name: Sylvia Salinas Medical record number: [id number] Date of birth: 1981/07/18 Age: 34 y.o. Gender: female Date of Admission: 12/15/2014  Date of Discharge: 12/17/2014 Admitting Physician: Carney LivingMarshall L Chambliss, MD  Primary Care Provider: No PCP Per Patient Consultants:  General surgery Gastroenterology  Indication for Hospitalization: Worsening abdominal pain and increasing LFTs in setting of recent gallstone pancreatitis  Discharge Diagnoses/Problem List:  Patient Active Problem List   Diagnosis Date Noted  . Calculus of bile duct with obstruction and without cholangitis or cholecystitis   . Elevated liver enzymes 12/15/2014  . Gallstones   . Acute pancreatitis   . Common bile duct dilation   . Nausea with vomiting   . Pancreatitis 11/30/2014   Disposition: Stable  Discharge Condition: Improved  Discharge Exam:  Gen: NAD. Non-toxic in appearance. Well developed, well nourished, pleasant AAF. Mildly obese.  HEENT: AT. [place]. Bilateral eyes without injections or icterus. MMM.  CV: RRR, no murmur appraciated Chest: CTAB, no wheeze or crackles Abd: Soft. Mildly obese. NTND. BS present. No Masses palpated. Well healing umbilical and port scars. Mild epigastric tenderness on exam, no R/G Ext: No erythema. No edema..  Neuro: Alert and oriented  Brief Hospital Course:  34 y/o female with PMH significant for recent gallstone pancreatitis and cholecystectomy presenting with abdominal pain and elevated LFTs.  While the previous cholecystectomy had improved her condition at discharge, she developed worsening abdominal pain in light of elevated and rising LFTs to AST/ALT 962/1029. It was noted that an intraoperative cholangiogram had been unable to be performed during her cholecystectomy so GI was consulted for ERCP. ERCP performed on 4/1 with sphincterotomy released 2 stones. Following the procedure, Ms.  Schafer's abdominal pain was vastly improved and labs began trending toward normal to AST/ALT 377/685. The day following the procedure it was felt that she was stable for discharge.   Issues for Follow Up:  1. Consider redraw of CMP to verify normalization of hepatic enzymes  Significant Procedures: ERCP with sphincterotomy 12/16/2014  Significant Labs and Imaging:   Recent Labs Lab 12/15/14 0715 12/16/14 0555  WBC 5.5 3.3*  HGB 11.7* 11.3*  HCT 36.4 36.6  PLT 352 356    Recent Labs Lab 12/15/14 0715 12/16/14 0555  NA 138 138  K 4.0 4.1  CL 103 105  CO2 25 28  GLUCOSE 124* 113*  BUN 9 <5*  CREATININE 0.71 0.86  CALCIUM 9.1 8.7  ALKPHOS 103 162*  AST 594* 962*  ALT 335* 1029*  ALBUMIN 3.8 3.4*      Results/Tests Pending at Time of Discharge: None  Discharge Medications:    Medication List    ASK your doctor about these medications        oxyCODONE-acetaminophen 5-325 MG per tablet  Commonly known as:  PERCOCET/ROXICET  Take 1-2 tablets by mouth every 4 (four) hours as needed for moderate pain or severe pain.        Discharge Instructions: Please refer to Patient Instructions section of EMR for full details.  Patient was counseled important signs and symptoms that should prompt return to medical care, changes in medications, dietary instructions, activity restrictions, and follow up appointments.   Follow-Up Appointments:   Alyssa A. Kennon RoundsHaney MD, MS Family Medicine Resident PGY-1 Pager [phone]

## 2014-12-17 LAB — LIPASE, BLOOD: LIPASE: 31 U/L (ref 11–59)

## 2014-12-17 LAB — COMPREHENSIVE METABOLIC PANEL
ALT: 685 U/L — ABNORMAL HIGH (ref 0–35)
AST: 377 U/L — ABNORMAL HIGH (ref 0–37)
Albumin: 3.2 g/dL — ABNORMAL LOW (ref 3.5–5.2)
Alkaline Phosphatase: 147 U/L — ABNORMAL HIGH (ref 39–117)
Anion gap: 4 — ABNORMAL LOW (ref 5–15)
BILIRUBIN TOTAL: 0.9 mg/dL (ref 0.3–1.2)
CALCIUM: 8.4 mg/dL (ref 8.4–10.5)
CO2: 26 mmol/L (ref 19–32)
Chloride: 108 mmol/L (ref 96–112)
Creatinine, Ser: 0.87 mg/dL (ref 0.50–1.10)
GFR calc non Af Amer: 87 mL/min — ABNORMAL LOW (ref >90)
GLUCOSE: 105 mg/dL — AB (ref 70–99)
Potassium: 3.8 mmol/L (ref 3.5–5.1)
Sodium: 138 mmol/L (ref 135–145)
Total Protein: 6.8 g/dL (ref 6.0–8.3)

## 2014-12-17 LAB — CBC
HCT: 34.5 % — ABNORMAL LOW (ref 36.0–46.0)
HEMOGLOBIN: 11 g/dL — AB (ref 12.0–15.0)
MCH: 26.9 pg (ref 26.0–34.0)
MCHC: 31.9 g/dL (ref 30.0–36.0)
MCV: 84.4 fL (ref 78.0–100.0)
PLATELETS: 326 10*3/uL (ref 150–400)
RBC: 4.09 MIL/uL (ref 3.87–5.11)
RDW: 13.3 % (ref 11.5–15.5)
WBC: 4.9 10*3/uL (ref 4.0–10.5)

## 2014-12-17 LAB — AMYLASE: Amylase: 41 U/L (ref 0–105)

## 2014-12-17 MED ORDER — OXYCODONE-ACETAMINOPHEN 5-325 MG PO TABS: 1.0000 | ORAL_TABLET | ORAL | Status: DC | PRN

## 2014-12-17 NOTE — Progress Notes (Signed)
D/C instructions given with pain med scripts. Reviewed with  patient  Follow up  Appointments,and to call and arrange schedule witth  Wellness clinic. Patient verbalized understanding.

## 2014-12-17 NOTE — Progress Notes (Signed)
   Patient Name: Sylvia Salinas Date of Encounter: 12/17/2014, 7:22 AM    Subjective  Feels ok - less abd pain, + flatus and stool Tol clears   Objective  BP 105/75 mmHg  Pulse 76  Temp(Src) 97.5 F (36.4 C) (Oral)  Resp 18  Ht 5\' 4"  (1.626 m)  Wt 193 lb 1.6 oz (87.59 kg)  BMI 33.13 kg/m2  SpO2 98%  LMP 11/24/2014 Abd is soft and NT, BS+  Lab Results  Component Value Date   ALT 685* 12/17/2014   AST 377* 12/17/2014   ALKPHOS 147* 12/17/2014   BILITOT 0.9 12/17/2014   Lab Results  Component Value Date   LIPASE 31 12/17/2014   Lab Results  Component Value Date   AMYLASE 41 12/17/2014      Assessment and Plan  Successful ERCP for choledocholithiasis - she is improved and should be ready for dc  I told her she could return to work Monday and also return to gym next week - she has been released by surgery as far as I know.  Starting low fat diet (stay on that for a few days  We will contact her from my office and arrange for LFT's to be done week of 4/10  Thanks  Iva Booparl E. Athony Coppa, MD, Antionette FairyFACG [facility] [phone] (pager) 12/17/2014 7:22 AM

## 2014-12-17 NOTE — Progress Notes (Signed)
D/C  Instructions given with  Pain med scripts. Patient verbalized understanding and reviewed follow up  appointments

## 2014-12-17 NOTE — Discharge Instructions (Signed)
You were admitted due to returning abdominal pain found to be due to gall stones retained in the common bile duct. These were released successfully by a procedure called an ERCP. This has cured the cause for your pain and laboratory abnormalities. You are clear for discharge with scheduled follow up as below. You should eat a low fat diet and seek medical care if your abdominal pain returns.   Consider establishing primary care within the [facility] system as this will facilitate improved communication between your medical providers.

## 2014-12-18 NOTE — Anesthesia Postprocedure Evaluation (Signed)
  Anesthesia Post-op Note  Patient: Sylvia Salinas  Procedure(s) Performed: Procedure(s) (LRB): ENDOSCOPIC RETROGRADE CHOLANGIOPANCREATOGRAPHY (ERCP) (N/A)  Patient Location: PACU  Anesthesia Type: GENERAL  Level of Consciousness: awake and alert   Airway and Oxygen Therapy: Patient Spontanous Breathing  Post-op Pain: mild  Post-op Assessment: Post-op Vital signs reviewed, Patient's Cardiovascular Status Stable, Respiratory Function Stable, Patent Airway and No signs of Nausea or vomiting  Last Vitals:  Filed Vitals:   12/17/14 1000  BP: 110/66  Pulse: 80  Temp: 36.7 C  Resp: 18    Post-op Vital Signs: stable   Complications: No apparent anesthesia complications

## 2014-12-19 ENCOUNTER — Other Ambulatory Visit: Payer: Self-pay

## 2014-12-19 ENCOUNTER — Encounter (HOSPITAL_COMMUNITY): Payer: Self-pay | Admitting: Internal Medicine

## 2014-12-19 DIAGNOSIS — K8050 Calculus of bile duct without cholangitis or cholecystitis without obstruction: Secondary | ICD-10-CM

## 2015-11-15 ENCOUNTER — Encounter (HOSPITAL_BASED_OUTPATIENT_CLINIC_OR_DEPARTMENT_OTHER): Payer: Self-pay

## 2015-11-15 ENCOUNTER — Emergency Department (HOSPITAL_BASED_OUTPATIENT_CLINIC_OR_DEPARTMENT_OTHER)
Admission: EM | Admit: 2015-11-15 | Discharge: 2015-11-15 | Disposition: A | Discharge status: Home / Self Care | Payer: Medicaid Other | Attending: Emergency Medicine | Admitting: Emergency Medicine

## 2015-11-15 DIAGNOSIS — J029 Acute pharyngitis, unspecified: Secondary | ICD-10-CM | POA: Insufficient documentation

## 2015-11-15 DIAGNOSIS — R0981 Nasal congestion: Secondary | ICD-10-CM | POA: Insufficient documentation

## 2015-11-15 DIAGNOSIS — R6889 Other general symptoms and signs: Secondary | ICD-10-CM

## 2015-11-15 DIAGNOSIS — H9203 Otalgia, bilateral: Secondary | ICD-10-CM | POA: Insufficient documentation

## 2015-11-15 DIAGNOSIS — Z8719 Personal history of other diseases of the digestive system: Secondary | ICD-10-CM | POA: Insufficient documentation

## 2015-11-15 DIAGNOSIS — R0989 Other specified symptoms and signs involving the circulatory and respiratory systems: Secondary | ICD-10-CM | POA: Insufficient documentation

## 2015-11-15 DIAGNOSIS — R509 Fever, unspecified: Secondary | ICD-10-CM | POA: Insufficient documentation

## 2015-11-15 MED ORDER — OSELTAMIVIR PHOSPHATE 75 MG PO CAPS: 75.0000 mg | ORAL_CAPSULE | Freq: Two times a day (BID) | ORAL | Status: DC

## 2015-11-15 MED FILL — OSELTAMIVIR PHOS 75 MG CAP: 75 | 5 days supply | Qty: 10 | Fill #0

## 2015-11-15 NOTE — Discharge Instructions (Signed)
Upper Respiratory Infection, Adult °Most upper respiratory infections (URIs) are a viral infection of the air passages leading to the lungs. A URI affects the nose, throat, and upper air passages. The most common type of URI is nasopharyngitis and is typically referred to as "the common cold." °URIs run their course and usually go away on their own. Most of the time, a URI does not require medical attention, but sometimes a bacterial infection in the upper airways can follow a viral infection. This is called a secondary infection. Sinus and middle ear infections are common types of secondary upper respiratory infections. °Bacterial pneumonia can also complicate a URI. A URI can worsen asthma and chronic obstructive pulmonary disease (COPD). Sometimes, these complications can require emergency medical care and may be life threatening.  °CAUSES °Almost all URIs are caused by viruses. A virus is a type of germ and can spread from one person to another.  °RISKS FACTORS °You may be at risk for a URI if:  °· You smoke.   °· You have chronic heart or lung disease. °· You have a weakened defense (immune) system.   °· You are very young or very old.   °· You have nasal allergies or asthma. °· You work in crowded or poorly ventilated areas. °· You work in health care facilities or schools. °SIGNS AND SYMPTOMS  °Symptoms typically develop 2-3 days after you come in contact with a cold virus. Most viral URIs last 7-10 days. However, viral URIs from the influenza virus (flu virus) can last 14-18 days and are typically more severe. Symptoms may include:  °· Runny or stuffy (congested) nose.   °· Sneezing.   °· Cough.   °· Sore throat.   °· Headache.   °· Fatigue.   °· Fever.   °· Loss of appetite.   °· Pain in your forehead, behind your eyes, and over your cheekbones (sinus pain). °· Muscle aches.   °DIAGNOSIS  °Your health care provider may diagnose a URI by: °· Physical exam. °· Tests to check that your symptoms are not due to  another condition such as: °· Strep throat. °· Sinusitis. °· Pneumonia. °· Asthma. °TREATMENT  °A URI goes away on its own with time. It cannot be cured with medicines, but medicines may be prescribed or recommended to relieve symptoms. Medicines may help: °· Reduce your fever. °· Reduce your cough. °· Relieve nasal congestion. °HOME CARE INSTRUCTIONS  °· Take medicines only as directed by your health care provider.   °· Gargle warm saltwater or take cough drops to comfort your throat as directed by your health care provider. °· Use a warm mist humidifier or inhale steam from a shower to increase air moisture. This may make it easier to breathe. °· Drink enough fluid to keep your urine clear or pale yellow.   °· Eat soups and other clear broths and maintain good nutrition.   °· Rest as needed.   °· Return to work when your temperature has returned to normal or as your health care provider advises. You may need to stay home longer to avoid infecting others. You can also use a face mask and careful hand washing to prevent spread of the virus. °· Increase the usage of your inhaler if you have asthma.   °· Do not use any tobacco products, including cigarettes, chewing tobacco, or electronic cigarettes. If you need help quitting, ask your health care provider. °PREVENTION  °The best way to protect yourself from getting a cold is to practice good hygiene.  °· Avoid oral or hand contact with people with cold   symptoms.   Wash your hands often if contact occurs.  There is no clear evidence that vitamin C, vitamin E, echinacea, or exercise reduces the chance of developing a cold. However, it is always recommended to get plenty of rest, exercise, and practice good nutrition.  SEEK MEDICAL CARE IF:   You are getting worse rather than better.   Your symptoms are not controlled by medicine.   You have chills.  You have worsening shortness of breath.  You have brown or red mucus.  You have yellow or brown nasal  discharge.  You have pain in your face, especially when you bend forward.  You have a fever.  You have swollen neck glands.  You have pain while swallowing.  You have white areas in the back of your throat. SEEK IMMEDIATE MEDICAL CARE IF:   You have severe or persistent:  Headache.  Ear pain.  Sinus pain.  Chest pain.  You have chronic lung disease and any of the following:  Wheezing.  Prolonged cough.  Coughing up blood.  A change in your usual mucus.  You have a stiff neck.  You have changes in your:  Vision.  Hearing.  Thinking.  Mood. MAKE SURE YOU:   Understand these instructions.  Will watch your condition.  Will get help right away if you are not doing well or get worse.   This information is not intended to replace advice given to you by your health care provider. Make sure you discuss any questions you have with your health care provider.   Document Released: 02/26/2001 Document Revised: 01/17/2015 Document Reviewed: 12/08/2013 Elsevier Interactive Patient Education 2016 Else[employer] Inc[employer]    Possible Influenza, Adult Influenza ("the flu") is a viral infection of the respiratory tract. It occurs more often in winter months because people spend more time in close contact with one another. Influenza can make you feel very sick. Influenza easily spreads from person to person (contagious). CAUSES  Influenza is caused by a virus that infects the respiratory tract. You can catch the virus by breathing in droplets from an infected person's cough or sneeze. You can also catch the virus by touching something that was recently contaminated with the virus and then touching your mouth, nose, or eyes. RISKS AND COMPLICATIONS You may be at risk for a more severe case of influenza if you smoke cigarettes, have diabetes, have chronic heart disease (such as heart failure) or lung disease (such as asthma), or if you have a weakened immune system. Elderly people and  pregnant women are also at risk for more serious infections. The most common problem of influenza is a lung infection (pneumonia). Sometimes, this problem can require emergency medical care and may be life threatening. SIGNS AND SYMPTOMS  Symptoms typically last 4 to 10 days and may include:  Fever.  Chills.  Headache, body aches, and muscle aches.  Sore throat.  Chest discomfort and cough.  Poor appetite.  Weakness or feeling tired.  Dizziness.  Nausea or vomiting. DIAGNOSIS  Diagnosis of influenza is often made based on your history and a physical exam. A nose or throat swab test can be done to confirm the diagnosis. TREATMENT  In mild cases, influenza goes away on its own. Treatment is directed at relieving symptoms. For more severe cases, your health care provider may prescribe antiviral medicines to shorten the sickness. Antibiotic medicines are not effective because the infection is caused by a virus, not by bacteria. HOME CARE INSTRUCTIONS  Take medicines only  as directed by your health care provider.  Use a cool mist humidifier to make breathing easier.  Get plenty of rest until your temperature returns to normal. This usually takes 3 to 4 days.  Drink enough fluid to keep your urine clear or pale yellow.  Cover yourmouth and nosewhen coughing or sneezing,and wash your handswellto prevent thevirusfrom spreading.  Stay homefromwork orschool untilthe fever is gonefor at least 67full day. PREVENTION  An annual influenza vaccination (flu shot) is the best way to avoid getting influenza. An annual flu shot is now routinely recommended for all adults in the [place] SEEK MEDICAL CARE IF:  You experiencechest pain, yourcough worsens,or you producemore mucus.  Youhave nausea,vomiting, ordiarrhea.  Your fever returns or gets worse. SEEK IMMEDIATE MEDICAL CARE IF:  You havetrouble breathing, you become short of breath,or your skin ornails  becomebluish.  You have severe painor stiffnessin the neck.  You develop a sudden headache, or pain in the face or ear.  You have nausea or vomiting that you cannot control. MAKE SURE YOU:   Understand these instructions.  Will watch your condition.  Will get help right away if you are not doing well or get worse.   This information is not intended to replace advice given to you by your health care provider. Make sure you discuss any questions you have with your health care provider.   Document Released: 08/30/2000 Document Revised: 09/23/2014 Document Reviewed: 12/02/2011 Elsevier Interactive Patient Education [employer]

## 2015-11-15 NOTE — ED Notes (Signed)
Pt c/o sore throat, congestion and bilateral ear pain, no fevers, here with both sons who have similar symptoms

## 2015-11-15 NOTE — ED Notes (Signed)
MD at bedside. 

## 2015-11-15 NOTE — ED Provider Notes (Addendum)
TIME SEEN: 6:50 AM  CHIEF COMPLAINT: Fever, cough, nasal congestion, bilateral ear pain, body aches, sore throat  HPI: Pt is a 35 y.o. female with previous history of acute pancreatitis who presents to the emergency department with 2 days of fever, sore throat, nasal congestion, dry cough, bilateral ear pain and bodyaches. Here with her 2 sons who have similar symptoms. No vomiting or diarrhea. Good appetite. No rash. No recent travel. Did not have an influenza vaccination this year.  ROS: See HPI Constitutional:  fever  Eyes: no drainage  ENT:  runny nose   Cardiovascular:  no chest pain  Resp: no SOB  GI: no vomiting GU: no dysuria Integumentary: no rash  Allergy: no hives  Musculoskeletal: no leg swelling  Neurological: no slurred speech ROS otherwise negative  PAST MEDICAL HISTORY/PAST SURGICAL HISTORY:  Past Medical History  Diagnosis Date  . Acute pancreatitis 11/30/2014    MEDICATIONS:  Prior to Admission medications   Medication Sig Start Date End Date Taking? Authorizing Provider  oxyCODONE-acetaminophen (ROXICET) 5-325 MG per tablet Take 1 tablet by mouth every 4 (four) hours as needed for severe pain. 12/17/14   Joanna Puff, MD    ALLERGIES:  No Known Allergies  SOCIAL HISTORY:  Social History  Substance Use Topics  . Smoking status: Never Smoker   . Smokeless tobacco: Never Used  . Alcohol Use: Yes     Comment: 11/30/2014 "glass of wine q couple weeks"    FAMILY HISTORY: Family History  Problem Relation Age of Onset  . Stroke Mother   . Stroke Father   . Kidney cancer Neg Hx   . Alcoholism Neg Hx   . Liver cancer Maternal Uncle     EXAM: BP 121/89 mmHg  Pulse 82  Temp(Src) 98.6 F (37 C) (Oral)  Resp 18  Ht  (1.626 m)  Wt 185 lb (83.915 kg)  BMI 31.74 kg/m2  SpO2 100%  LMP 10/21/2015 CONSTITUTIONAL: Alert and oriented and responds appropriately to questions. Well-appearing; well-nourished, afebrile and nontoxic-appearing HEAD:  Normocephalic EYES: Conjunctivae clear, PERRL ENT: normal nose; no rhinorrhea; moist mucous membranes; No pharyngeal erythema or petechiae, no tonsillar hypertrophy or exudate, no uvular deviation, no trismus or drooling, normal phonation, no stridor, no dental caries or abscess noted, no Ludwig's angina, tongue sits flat in the bottom of the mouth; TMs are clear bilaterally without erythema, purulence, bulging, perforation, effusion. No signs of mastoiditis. No pain with manipulation of the pinna bilaterally.  No cerumen impaction. NECK: Supple, no meningismus, no LAD; no nuchal rigidity CARD: RRR; S1 and S2 appreciated; no murmurs, no clicks, no rubs, no gallops RESP: Normal chest excursion without splinting or tachypnea; breath sounds clear and equal bilaterally; no wheezes, no rhonchi, no rales, no hypoxia or respiratory distress, speaking full sentences ABD/GI: Normal bowel sounds; non-distended; soft, non-tender, no rebound, no guarding, no peritoneal signs BACK:  The back appears normal and is non-tender to palpation, there is no CVA tenderness EXT: Normal ROM in all joints; non-tender to palpation; no edema; normal capillary refill; no cyanosis, no calf tenderness or swelling    SKIN: Normal color for age and race; warm; no rash NEURO: Moves all extremities equally, sensation to light touch intact diffusely, cranial nerves II through XII intact PSYCH: The patient's mood and manner are appropriate. Grooming and personal hygiene are appropriate.  MEDICAL DECISION MAKING: Patient here with complaints of flulike symptoms. Sons have similar symptoms. She is afebrile and nontoxic. Lungs are clear to auscultation.  No meningismus. Abdomen soft and nontender. Have offered her Tamiflu and she is within treatment window and she agrees to this medication. Have recommended alternating Tylenol and ibuprofen for pain. Recommend increase fluid intake, rest. Will provide with work note. Discussed return  precautions. Patient verbalizes understanding and is comfortable with this plan.      Layla Maw Mahmood Boehringer, DO 11/15/15 [id number]     Of note, patient's son did test positive for strep.  He may be a carrier for strep as he only has mild pharyngeal erythema with no tonsillar hypertrophy or exudate. Patient's throat shows no erythema, petechiae, tonsillar hypertrophy or accident, uvular deviation. She is having body aches and a cough as well as bilateral ear pain. I suspect this is unlikely a viral illness I do not feel this time she needs a strep swab or to being treated empirically for strep throat and she agrees with this plan. We'll still continue with Tamiflu, alternating Tylenol and Motrin. Have discussed with her that if she does develop worsening sore throat, tonsillar hypertrophy or exudate that she should follow-up with her primary care physician for further treatment.  Layla Maw Mamta Rimmer, DO 11/15/15 [phone]

## 2019-08-02 ENCOUNTER — Ambulatory Visit
Admission: EM | Admit: 2019-08-02 | Discharge: 2019-08-02 | Disposition: A | Discharge status: Home / Self Care | Payer: Self-pay | Attending: Emergency Medicine | Admitting: Emergency Medicine

## 2019-08-02 ENCOUNTER — Other Ambulatory Visit: Payer: Self-pay

## 2019-08-02 ENCOUNTER — Encounter: Payer: Self-pay | Admitting: Emergency Medicine

## 2019-08-02 DIAGNOSIS — H6022 Malignant otitis externa, left ear: Secondary | ICD-10-CM

## 2019-08-02 MED ORDER — CIPROFLOXACIN HCL 500 MG PO TABS: 500.0000 mg | ORAL_TABLET | Freq: Two times a day (BID) | ORAL | 0 refills | Status: AC

## 2019-08-02 MED ORDER — NEOMYCIN-POLYMYXIN-HC 3.5-10000-1 OT SUSP: 4.0000 [drp] | Freq: Three times a day (TID) | OTIC | 0 refills | Status: DC

## 2019-08-02 NOTE — Discharge Instructions (Addendum)
Take antibiotic as prescribed. Important to follow-up with ENT in 1 week: Call to make this appointment. Go to ER for worsening jaw pain, high fever, loss of hearing.

## 2019-08-02 NOTE — ED Triage Notes (Addendum)
Pt presents to [facility] for assessment of left ear pain since Friday.  Patient states she started an antibiotic on Friday (Augmentin) and started using a swimmers ear drop and states it is just getting worse.

## 2019-08-02 NOTE — ED Provider Notes (Signed)
[facility]    CSN: [id number] Arrival date & time: 08/02/19  1106      History   Chief Complaint Chief Complaint  Patient presents with  . Otalgia    HPI Sylvia Salinas is a 38 y.o. female presenting for left ear pain since Friday.  States she received a prescription for Augmentin on Friday by someone she works with and has been using OTC swimmer's ear drops without significant relief.  Patient does endorse preceding bilateral ear discomfort since 11/10.  She is also tried heating pads, Sudafed without relief.  Patient denies history of diabetes, immunocompromise status.  No fever, chills, arthralgias, jaw pain.   Past Medical History:  Diagnosis Date  . Acute pancreatitis 11/30/2014    Patient Active Problem List   Diagnosis Date Noted  . Choledocholithiasis with obstruction 12/16/2014  . Calculus of bile duct with obstruction and without cholangitis or cholecystitis   . Elevated liver enzymes 12/15/2014  . Gallstones   . Acute pancreatitis   . Common bile duct dilation   . Nausea with vomiting   . Pancreatitis 11/30/2014    Past Surgical History:  Procedure Laterality Date  . ABDOMINAL HERNIA REPAIR  2005  . CESAREAN SECTION  04/2003; 01/2009  . CHOLECYSTECTOMY N/A 12/02/2014   Procedure: LAPAROSCOPIC CHOLECYSTECTOMY WITH ATTEMPTED INTRAOPERATIVE CHOLANGIOGRAM;  Surgeon: Coralie Keens, MD;  Location: [place];  Service: General;  Laterality: N/A;  . ERCP N/A 12/16/2014   Procedure: ENDOSCOPIC RETROGRADE CHOLANGIOPANCREATOGRAPHY (ERCP);  Surgeon: Gatha Mayer, MD;  Location: [facility] ENDOSCOPY;  Service: Endoscopy;  Laterality: N/A;  . HERNIA REPAIR  2005    OB History   No obstetric history on file.      Home Medications    Prior to Admission medications   Medication Sig Start Date End Date Taking? Authorizing Provider  ciprofloxacin (CIPRO) 500 MG tablet Take 1 tablet (500 mg total) by mouth every 12 (twelve) hours for 7 days. 08/02/19 08/09/19   Hall-Potvin, [place], PA-C  neomycin-polymyxin-hydrocortisone (CORTISPORIN) 3.5-10000-1 OTIC suspension Place 4 drops into both ears 3 (three) times daily. 08/02/19   Hall-Potvin, [place], PA-C    Family History Family History  Problem Relation Age of Onset  . Stroke Mother   . Stroke Father   . Liver cancer Maternal Uncle   . Kidney cancer Neg Hx   . Alcoholism Neg Hx     Social History Social History   Tobacco Use  . Smoking status: Never Smoker  . Smokeless tobacco: Never Used  Substance Use Topics  . Alcohol use: Yes    Comment: 11/30/2014 "glass of wine q couple weeks"  . Drug use: No     Allergies   Patient has no known allergies.   Review of Systems Review of Systems  Constitutional: Negative for fatigue and fever.  HENT: Positive for ear pain. Negative for congestion, dental problem, ear discharge, facial swelling, hearing loss, sinus pain, sore throat, tinnitus, trouble swallowing and voice change.   Eyes: Negative for photophobia, pain and visual disturbance.  Respiratory: Negative for cough and shortness of breath.   Cardiovascular: Negative for chest pain and palpitations.  Gastrointestinal: Negative for diarrhea and vomiting.  Musculoskeletal: Negative for arthralgias and myalgias.  Neurological: Negative for dizziness and headaches.     Physical Exam Triage Vital Signs ED Triage Vitals [08/02/19 1114]  Enc Vitals Group     BP (!) 147/83     Pulse Rate 98     Resp 18  Temp 99.7 F (37.6 C)     Temp Source Temporal     SpO2 99 %     Weight      Height      Head Circumference      Peak Flow      Pain Score 4     Pain Loc      Pain Edu?      Excl. in GC?    No data found.  Updated Vital Signs BP (!) 147/83 (BP Location: Left Arm)   Pulse 98   Temp 99.7 F (37.6 C) (Temporal)   Resp 18   LMP 07/10/2019   SpO2 99%   Visual Acuity Right Eye Distance:   Left Eye Distance:   Bilateral Distance:    Right Eye Near:   Left Eye  Near:    Bilateral Near:     Physical Exam Constitutional:      General: She is not in acute distress. HENT:     Head: Normocephalic and atraumatic.     Jaw: There is normal jaw occlusion. No tenderness or pain on movement.     Comments: Mild swelling at left preauricular area that is tender, without fluctuance or erythema    Right Ear: Hearing and external ear normal. No tenderness. No mastoid tenderness.     Left Ear: Hearing and external ear normal. No tenderness. No mastoid tenderness.     Ears:     Comments: Tragal tenderness bilaterally (L>R). Left ear canal occluded, with discharge. Right ear canal edematous w/ discharge    Nose: No nasal deformity, septal deviation or nasal tenderness.     Right Turbinates: Not swollen or pale.     Left Turbinates: Not swollen or pale.     Right Sinus: No maxillary sinus tenderness or frontal sinus tenderness.     Left Sinus: No maxillary sinus tenderness or frontal sinus tenderness.     Mouth/Throat:     Lips: Pink. No lesions.     Mouth: Mucous membranes are moist. No injury.     Pharynx: Oropharynx is clear. Uvula midline. No posterior oropharyngeal erythema or uvula swelling.     Comments: no tonsillar exudate or hypertrophy Neck:     Musculoskeletal: Normal range of motion and neck supple. No muscular tenderness.  Cardiovascular:     Rate and Rhythm: Normal rate.  Pulmonary:     Effort: Pulmonary effort is normal.  Lymphadenopathy:     Cervical: No cervical adenopathy.  Neurological:     Mental Status: She is alert and oriented to person, place, and time.      UC Treatments / Results  Labs (all labs ordered are listed, but only abnormal results are displayed) Labs Reviewed - No data to display  EKG   Radiology No results found.  Procedures Procedures (including critical care time)  Medications Ordered in UC Medications - No data to display  Initial Impression / Assessment and Plan / UC Course  I have reviewed the  triage vital signs and the nursing notes.  Pertinent labs & imaging results that were available during my care of the patient were reviewed by me and considered in my medical decision making (see chart for details).     Patient afebrile, nontoxic today in office.  H&P concerning for malignant otitis: We will add Cortisporin for edema/pain relief as well as ciprofloxacin as outlined below.  Patient to follow-up with PCP in 2 to 3 days for reevaluation.  Strict return precautions discussed, patient  verbalized understanding and is agreeable to plan. Final Clinical Impressions(s) / UC Diagnoses   Final diagnoses:  Acute malignant otitis externa of left ear     Discharge Instructions     Take antibiotic as prescribed. Important to follow-up with ENT in 1 week: Call to make this appointment. Go to ER for worsening jaw pain, high fever, loss of hearing.    ED Prescriptions    Medication Sig Dispense Auth. Provider   neomycin-polymyxin-hydrocortisone (CORTISPORIN) 3.5-10000-1 OTIC suspension Place 4 drops into both ears 3 (three) times daily. 10 mL Hall-Potvin, [place], PA-C   ciprofloxacin (CIPRO) 500 MG tablet Take 1 tablet (500 mg total) by mouth every 12 (twelve) hours for 7 days. 14 tablet Hall-Potvin, [place], PA-C     PDMP not reviewed this encounter.   Odette FractionHall-Potvin, [place]itt[place], [place] JerseyPA-C 08/05/19 [phone]

## 2019-08-13 ENCOUNTER — Telehealth: Payer: Medicaid Other | Admitting: Family

## 2019-08-13 DIAGNOSIS — Z20822 Contact with and (suspected) exposure to covid-19: Secondary | ICD-10-CM

## 2019-08-13 NOTE — Progress Notes (Signed)
E-Visit for .Corona. Virus Screening   Your current symptoms could be consistent with the coronavirus.  Many health care providers can now test patients at their office but not all are.  [facility] has multiple testing sites. For information on our COVID testing locations and hours go to [email or web address]  Please quarantine yourself while awaiting your test results.  We are enrolling you in our MyChart Home Montioring for COVID19 . Daily you will receive a questionnaire within the MyChart website. Our COVID 19 response team willl be monitoriing your responses daily. Please continue good preventive care measures, including:  frequent hand-washing, avoid touching your face, cover coughs/sneezes, stay out of crowds and keep a 6 foot distance from others.    COVID-19 is a respiratory illness with symptoms that are similar to the flu. Symptoms are typically mild to moderate, but there have been cases of severe illness and death due to the virus. The following symptoms may appear 2-14 days after exposure: . Fever . Cough . Shortness of breath or difficulty breathing . Chills . Repeated shaking with chills . Muscle pain . Headache . Sore throat . New loss of taste or smell . Fatigue . Congestion or runny nose . Nausea or vomiting . Diarrhea  If you develop fever/cough/breathlessness, please stay home for 10 days with improving symptoms and until you have had 24 hours of no fever (without taking a fever reducer).  Go to the nearest hospital ED for assessment if fever/cough/breathlessness are severe or illness seems like a threat to life.  It is vitally important that if you feel that you have an infection such as this virus or any other virus that you stay home and away from places where you may spread it to others.  You should avoid contact with people age 65 and older.   You should wear a mask or cloth face covering over your nose and mouth if you must be around other  people or animals, including pets (even at home). Try to stay at least 6 feet away from other people. This will protect the people around you.  You may also take acetaminophen (Tylenol) as needed for fever.   Reduce your risk of any infection by using the same precautions used for avoiding the common cold or flu:  . Wash your hands often with soap and warm water for at least 20 seconds.  If soap and water are not readily available, use an alcohol-based hand sanitizer with at least 60% alcohol.  . If coughing or sneezing, cover your mouth and nose by coughing or sneezing into the elbow areas of your shirt or coat, into a tissue or into your sleeve (not your hands). . Avoid shaking hands with others and consider head nods or verbal greetings only. . Avoid touching your eyes, nose, or mouth with unwashed hands.  . Avoid close contact with people who are sick. . Avoid places or events with large numbers of people in one location, like concerts or sporting events. . Carefully consider travel plans you have or are making. . If you are planning any travel outside or inside the [place], visit the CDC's Travelers' Health webpage for the latest health notices. . If you have some symptoms but not all symptoms, continue to monitor at home and seek medical attention if your symptoms worsen. . If you are having a medical emergency, call 911.  HOME CARE . Only take medications as instructed by your medical team. . Drink plenty of fluids and get   plenty of rest. . A steam or ultrasonic humidifier can help if you have congestion.   GET HELP RIGHT AWAY IF YOU HAVE EMERGENCY WARNING SIGNS** FOR COVID-19. If you or someone is showing any of these signs seek emergency medical care immediately. Call 911 or proceed to your closest emergency facility if: . You develop worsening high fever. . Trouble breathing . Bluish lips or face . Persistent pain or pressure in the chest . New confusion . Inability to wake or stay  awake . You cough up blood. . Your symptoms become more severe  **This list is not all possible symptoms. Contact your medical provider for any symptoms that are sever or concerning to you.   MAKE SURE YOU   Understand these instructions.  Will watch your condition.  Will get help right away if you are not doing well or get worse.  Your e-visit answers were reviewed by a board certified advanced clinical practitioner to complete your personal care plan.  Depending on the condition, your plan could have included both over the counter or prescription medications.  If there is a problem please reply once you have received a response from your provider.  Your safety is important to us.  If you have drug allergies check your prescription carefully.    You can use MyChart to ask questions about today's visit, request a non-urgent call back, or ask for a work or school excuse for 24 hours related to this e-Visit. If it has been greater than 24 hours you will need to follow up with your provider, or enter a new e-Visit to address those concerns. You will get an e-mail in the next two days asking about your experience.  I hope that your e-visit has been valuable and will speed your recovery. Thank you for using e-visits.   Greater than 5 minutes, yet less than 10 minutes of time have been spent researching, coordinating, and implementing care for this patient today.  Thank you for the details you included in the comment boxes. Those details are very helpful in determining the best course of treatment for you and help us to provide the best care.  

## 2019-08-15 ENCOUNTER — Emergency Department (HOSPITAL_BASED_OUTPATIENT_CLINIC_OR_DEPARTMENT_OTHER)
Admission: EM | Admit: 2019-08-15 | Discharge: 2019-08-15 | Disposition: A | Discharge status: Home / Self Care | Payer: Self-pay | Attending: Emergency Medicine | Admitting: Emergency Medicine

## 2019-08-15 ENCOUNTER — Emergency Department (HOSPITAL_BASED_OUTPATIENT_CLINIC_OR_DEPARTMENT_OTHER): Payer: Self-pay

## 2019-08-15 ENCOUNTER — Encounter (HOSPITAL_BASED_OUTPATIENT_CLINIC_OR_DEPARTMENT_OTHER): Payer: Self-pay

## 2019-08-15 ENCOUNTER — Other Ambulatory Visit: Payer: Self-pay

## 2019-08-15 DIAGNOSIS — U071 COVID-19: Secondary | ICD-10-CM | POA: Insufficient documentation

## 2019-08-15 DIAGNOSIS — J189 Pneumonia, unspecified organism: Secondary | ICD-10-CM

## 2019-08-15 DIAGNOSIS — R509 Fever, unspecified: Secondary | ICD-10-CM

## 2019-08-15 DIAGNOSIS — Z79899 Other long term (current) drug therapy: Secondary | ICD-10-CM | POA: Insufficient documentation

## 2019-08-15 MED ORDER — ACETAMINOPHEN 325 MG PO TABS
650.0000 mg | ORAL_TABLET | Freq: Once | ORAL | Status: AC | PRN
  Administered 2019-08-15: 17:00:00 650 mg via ORAL
  Filled 2019-08-15: qty 2

## 2019-08-15 MED ORDER — DOXYCYCLINE HYCLATE 100 MG PO TABS
100.0000 mg | ORAL_TABLET | Freq: Once | ORAL | Status: AC
  Administered 2019-08-15: 19:00:00 100 mg via ORAL
  Filled 2019-08-15: qty 1

## 2019-08-15 MED ORDER — DOXYCYCLINE HYCLATE 100 MG PO CAPS: 100.0000 mg | ORAL_CAPSULE | Freq: Two times a day (BID) | ORAL | 0 refills | Status: DC

## 2019-08-15 NOTE — ED Notes (Signed)
ED Provider at bedside. 

## 2019-08-15 NOTE — ED Provider Notes (Signed)
[facility] EMERGENCY DEPARTMENT Provider Note   CSN: [id number] Arrival date & time: 08/15/19  1511     History   Chief Complaint Chief Complaint  Patient presents with  . Generalized Body Aches    HPI Sylvia Salinas is a 38 y.o. female.     HPI Patient reports that she was recently treated for ear infection.  Last week she took ciprofloxacin and eardrops.  She reports that those symptoms have improved quite a bit.  She reports she had a lot of swelling and that is gone now.  She was seen in follow-up by ENT at the beginning of the week and told to continue only the eardrops at this point.  She reports that she however has noted now starting about 4 days ago that she got a lot of generalized body aches and significant fatigue.  She reports she has somewhat of a generalized headache behind her eyes and her eyelids.  She reports she has been having several episodes of diarrhea per day.  She is also developed fever up to 101 that has waxed and waned.  She denies she has had any shortness of breath or significant cough.  No sore throat.  Reports predominant symptoms are generalized achiness and feeling very fatigued. Past Medical History:  Diagnosis Date  . Acute pancreatitis 11/30/2014    Patient Active Problem List   Diagnosis Date Noted  . Choledocholithiasis with obstruction 12/16/2014  . Calculus of bile duct with obstruction and without cholangitis or cholecystitis   . Elevated liver enzymes 12/15/2014  . Gallstones   . Acute pancreatitis   . Common bile duct dilation   . Nausea with vomiting   . Pancreatitis 11/30/2014    Past Surgical History:  Procedure Laterality Date  . ABDOMINAL HERNIA REPAIR  2005  . CESAREAN SECTION  04/2003; 01/2009  . CHOLECYSTECTOMY N/A 12/02/2014   Procedure: LAPAROSCOPIC CHOLECYSTECTOMY WITH ATTEMPTED INTRAOPERATIVE CHOLANGIOGRAM;  Surgeon: Abigail Miyamotoouglas [name], MD;  Location: [facility] OR;  Service: General;  Laterality: N/A;  . ERCP N/A  12/16/2014   Procedure: ENDOSCOPIC RETROGRADE CHOLANGIOPANCREATOGRAPHY (ERCP);  Surgeon: Iva Booparl E Gessner, MD;  [facility];  Service: Endoscopy;  Laterality: N/A;  . HERNIA REPAIR  2005     OB History   No obstetric history on file.      Home Medications    Prior to Admission medications   Medication Sig Start Date End Date Taking? Authorizing Provider  doxycycline (VIBRAMYCIN) 100 MG capsule Take 1 capsule (100 mg total) by mouth 2 (two) times daily. One po bid x 7 days 08/15/19   Arby BarrettePfeiffer, Shacara Cozine, MD  neomycin-polymyxin-hydrocortisone (CORTISPORIN) 3.5-10000-1 OTIC suspension Place 4 drops into both ears 3 (three) times daily. 08/02/19   Hall-Potvin, [place], PA-C    Family History Family History  Problem Relation Age of Onset  . Stroke Mother   . Stroke Father   . Liver cancer Maternal Uncle   . Kidney cancer Neg Hx   . Alcoholism Neg Hx     Social History Social History   Tobacco Use  . Smoking status: Never Smoker  . Smokeless tobacco: Never Used  Substance Use Topics  . Alcohol use: Not Currently    Comment: 11/30/2014 "glass of wine q couple weeks"  . Drug use: No     Allergies   Patient has no known allergies.   Review of Systems Review of Systems 10 Systems reviewed and are negative for acute change except as noted in the HPI.  Physical Exam Updated Vital Signs BP 111/76 (BP Location: Right Leg)   Pulse 97   Temp (!) 100.6 F (38.1 C)   Resp 18   Ht 5\' 4"  (1.626 m)   Wt 90.7 kg   LMP 08/04/2019   SpO2 96%   BMI 34.33 kg/m   Physical Exam Constitutional:      Comments: Patient is alert and nontoxic.  Clinically well in appearance.  No respiratory distress.  HENT:     Head: Normocephalic and atraumatic.     Comments: No facial swelling.  Ears do not have swelling of the pinna or the canals.  Left ear still has some erythema of the TM.  It is not bulging or significantly red.  Right TM is only slightly visible.  She has a small  pellet of wax tightly against the TM.  The canal however was fully visible and the rim of visible TM is not erythematous.  She does not have any pain with movement of the tragus or the pinna.  The cavity is widely patent.  No erythema or exudate on the tonsils. Eyes:     Extraocular Movements: Extraocular movements intact.     Conjunctiva/sclera: Conjunctivae normal.     Pupils: Pupils are equal, round, and reactive to light.  Neck:     Musculoskeletal: Neck supple.  Cardiovascular:     Rate and Rhythm: Normal rate and regular rhythm.  Pulmonary:     Effort: Pulmonary effort is normal.     Breath sounds: Normal breath sounds.  Abdominal:     General: There is no distension.     Palpations: Abdomen is soft.     Tenderness: There is no abdominal tenderness. There is no guarding.  Musculoskeletal: Normal range of motion.        General: No swelling or tenderness.     Right lower leg: No edema.     Left lower leg: No edema.  Lymphadenopathy:     Cervical: No cervical adenopathy.  Skin:    General: Skin is warm and dry.  Neurological:     General: No focal deficit present.     Mental Status: She is oriented to person, place, and time.     Coordination: Coordination normal.  Psychiatric:        Mood and Affect: Mood normal.      ED Treatments / Results  Labs (all labs ordered are listed, but only abnormal results are displayed) Labs Reviewed  SARS CORONAVIRUS 2 (TAT 6-24 HRS)    EKG None  Radiology Dg Chest Port 1 View  Result Date: 08/15/2019 CLINICAL DATA:  Cough. EXAM: PORTABLE CHEST 1 VIEW COMPARISON:  None. FINDINGS: Mild infiltrate in the left base. The heart, hila, mediastinum, lungs, and pleura are otherwise normal. IMPRESSION: Mild infiltrate in the left base is worrisome for pneumonia given history. Recommend short-term follow-up imaging to ensure resolution. Electronically Signed   By: 08/17/2019 III M.D   On: 08/15/2019 18:59    Procedures Procedures  (including critical care time)  Medications Ordered in ED Medications  doxycycline (VIBRA-TABS) tablet 100 mg (has no administration in time range)  acetaminophen (TYLENOL) tablet 650 mg (650 mg Oral Given 08/15/19 1641)     Initial Impression / Assessment and Plan / ED Course  I have reviewed the triage vital signs and the nursing notes.  Pertinent labs & imaging results that were available during my care of the patient were reviewed by me and considered in my medical decision  making (see chart for details).       Patient is clinically well appearance.  She is experiencing generalized symptoms of fatigue and myalgia as predominant symptoms.  Mild headache that is frontal and diffuse in nature.  She does appear to be significantly resolving otitis media and externa that had previously been present.  I do not suspect that this is secondary complications from that.  Chest x-ray is interpreted radiology for a suspicion of possible left lower lobe infiltrate.  She is not having any productive cough.  She is not short of breath.  I will opt to treat empirically for community-acquired pneumonia as this is unilateral and she does have fever and generalized symptoms.  Patient be placed on doxycycline.  She is also counseled that symptoms are suggestive of Covid.  She will isolate and continue to observe for any additional symptoms.  Return precautions reviewed.  Patient is discharged in good condition with clear mental status and no respiratory distress.  Lemoyne Scarpati was evaluated in Emergency Department on 08/15/2019 for the symptoms described in the history of present illness. She was evaluated in the context of the global COVID-19 pandemic, which necessitated consideration that the patient might be at risk for infection with the SARS-CoV-2 virus that causes COVID-19. Institutional protocols and algorithms that pertain to the evaluation of patients at risk for COVID-19 are in a state of rapid  change based on information released by regulatory bodies including the CDC and federal and state organizations. These policies and algorithms were followed during the patient's care in the ED.  Final Clinical Impressions(s) / ED Diagnoses   Final diagnoses:  Community acquired pneumonia of left lower lobe of lung    ED Discharge Orders         Ordered    doxycycline (VIBRAMYCIN) 100 MG capsule  2 times daily     08/15/19 1918           Charlesetta Shanks, MD 08/15/19 1928

## 2019-08-15 NOTE — ED Triage Notes (Signed)
Pt reports that she has finished treatment for an outer ear infection last week and since Tuesday she has had headaches, diarrhea, and fevers ranging around 101. Pt is 101 in triage, NAD, other VSS. Pt reports taking dayquil at 1300.

## 2019-08-15 NOTE — Discharge Instructions (Signed)
1.  Your chest x-ray was interpreted by radiology as having some findings suspicious for pneumonia in the left lower lung.  You are being treated with doxycycline for this.  It is still possible that you have Covid.  You need to isolate and wait for your test results. 2.  Try to schedule a follow-up appointment with your family doctor within the next 3 to 5 days.  If you do not have a family doctor, use the referral number in your discharge instructions to find one. 3.  Return to the emergency department if you develop any shortness of breath, confusion, significant weakness or other concerning symptoms. 4.  Take extra strength Tylenol every 6-8 hours for control of fever and body aches.    Person Under Monitoring Name: Sylvia Salinas  Location: [place] [address]   Infection Prevention Recommendations for Individuals Confirmed to have, or Being Evaluated for, 2019 Novel Coronavirus (COVID-19) Infection Who Receive Care at Home  Individuals who are confirmed to have, or are being evaluated for, COVID-19 should follow the prevention steps below until a healthcare provider or local or state health department says they can return to normal activities.  Stay home except to get medical care You should restrict activities outside your home, except for getting medical care. Do not go to work, school, or public areas, and do not use public transportation or taxis.  Call ahead before visiting your doctor Before your medical appointment, call the healthcare provider and tell them that you have, or are being evaluated for, COVID-19 infection. This will help the healthcare providers office take steps to keep other people from getting infected. Ask your healthcare provider to call the local or state health department.  Monitor your symptoms Seek prompt medical attention if your illness is worsening (e.g., difficulty breathing). Before going to your medical appointment, call  the healthcare provider and tell them that you have, or are being evaluated for, COVID-19 infection. Ask your healthcare provider to call the local or state health department.  Wear a facemask You should wear a facemask that covers your nose and mouth when you are in the same room with other people and when you visit a healthcare provider. People who live with or visit you should also wear a facemask while they are in the same room with you.  Separate yourself from other people in your home As much as possible, you should stay in a different room from other people in your home. Also, you should use a separate bathroom, if available.  Avoid sharing household items You should not share dishes, drinking glasses, cups, eating utensils, towels, bedding, or other items with other people in your home. After using these items, you should wash them thoroughly with soap and water.  Cover your coughs and sneezes Cover your mouth and nose with a tissue when you cough or sneeze, or you can cough or sneeze into your sleeve. Throw used tissues in a lined trash can, and immediately wash your hands with soap and water for at least 20 seconds or use an alcohol-based hand rub.  Wash your [facility] your hands often and thoroughly with soap and water for at least 20 seconds. You can use an alcohol-based hand sanitizer if soap and water are not available and if your hands are not visibly dirty. Avoid touching your eyes, nose, and mouth with unwashed hands.   Prevention Steps for Caregivers and Household Members of Individuals Confirmed to have, or Being Evaluated for,  COVID-19 Infection Being Cared for in the Home  If you live with, or provide care at home for, a person confirmed to have, or being evaluated for, COVID-19 infection please follow these guidelines to prevent infection:  Follow healthcare providers instructions Make sure that you understand and can help the patient follow any healthcare  provider instructions for all care.  Provide for the patients basic needs You should help the patient with basic needs in the home and provide support for getting groceries, prescriptions, and other personal needs.  Monitor the patients symptoms If they are getting sicker, call his or her medical provider and tell them that the patient has, or is being evaluated for, COVID-19 infection. This will help the healthcare providers office take steps to keep other people from getting infected. Ask the healthcare provider to call the local or state health department.  Limit the number of people who have contact with the patient If possible, have only one caregiver for the patient. Other household members should stay in another home or place of residence. If this is not possible, they should stay in another room, or be separated from the patient as much as possible. Use a separate bathroom, if available. Restrict visitors who do not have an essential need to be in the home.  Keep older adults, very young children, and other sick people away from the patient Keep older adults, very young children, and those who have compromised immune systems or chronic health conditions away from the patient. This includes people with chronic heart, lung, or kidney conditions, diabetes, and cancer.  Ensure good ventilation Make sure that shared spaces in the home have good air flow, such as from an air conditioner or an opened window, weather permitting.  Wash your hands often Wash your hands often and thoroughly with soap and water for at least 20 seconds. You can use an alcohol based hand sanitizer if soap and water are not available and if your hands are not visibly dirty. Avoid touching your eyes, nose, and mouth with unwashed hands. Use disposable paper towels to dry your hands. If not available, use dedicated cloth towels and replace them when they become wet.  Wear a facemask and gloves Wear a  disposable facemask at all times in the room and gloves when you touch or have contact with the patients blood, body fluids, and/or secretions or excretions, such as sweat, saliva, sputum, nasal mucus, vomit, urine, or feces.  Ensure the mask fits over your nose and mouth tightly, and do not touch it during use. Throw out disposable facemasks and gloves after using them. Do not reuse. Wash your hands immediately after removing your facemask and gloves. If your personal clothing becomes contaminated, carefully remove clothing and launder. Wash your hands after handling contaminated clothing. Place all used disposable facemasks, gloves, and other waste in a lined container before disposing them with other household waste. Remove gloves and wash your hands immediately after handling these items.  Do not share dishes, glasses, or other household items with the patient Avoid sharing household items. You should not share dishes, drinking glasses, cups, eating utensils, towels, bedding, or other items with a patient who is confirmed to have, or being evaluated for, COVID-19 infection. After the person uses these items, you should wash them thoroughly with soap and water.  Wash laundry thoroughly Immediately remove and wash clothes or bedding that have blood, body fluids, and/or secretions or excretions, such as sweat, saliva, sputum, nasal mucus, vomit, urine, or  feces, on them. Wear gloves when handling laundry from the patient. Read and follow directions on labels of laundry or clothing items and detergent. In general, wash and dry with the warmest temperatures recommended on the label.  Clean all areas the individual has used often Clean all touchable surfaces, such as counters, tabletops, doorknobs, bathroom fixtures, toilets, phones, keyboards, tablets, and bedside tables, every day. Also, clean any surfaces that may have blood, body fluids, and/or secretions or excretions on them. Wear gloves when  cleaning surfaces the patient has come in contact with. Use a diluted bleach solution (e.g., dilute bleach with 1 part bleach and 10 parts water) or a household disinfectant with a label that says EPA-registered for coronaviruses. To make a bleach solution at home, add 1 tablespoon of bleach to 1 quart (4 cups) of water. For a larger supply, add  cup of bleach to 1 gallon (16 cups) of water. Read labels of cleaning products and follow recommendations provided on product labels. Labels contain instructions for safe and effective use of the cleaning product including precautions you should take when applying the product, such as wearing gloves or eye protection and making sure you have good ventilation during use of the product. Remove gloves and wash hands immediately after cleaning.  Monitor yourself for signs and symptoms of illness Caregivers and household members are considered close contacts, should monitor their health, and will be asked to limit movement outside of the home to the extent possible. Follow the monitoring steps for close contacts listed on the symptom monitoring form.   ? If you have additional questions, contact your local health department or call the epidemiologist on call at [phone] (available 24/7). ? This guidance is subject to change. For the most up-to-date guidance from [facility], please refer to their website: TripMetro.huhttps://www.cdc.gov/coronavirus/2019-ncov/hcp/guidance-prevent-spread.html

## 2019-08-16 LAB — SARS CORONAVIRUS 2 (TAT 6-24 HRS): SARS Coronavirus 2: POSITIVE — AB

## 2021-10-13 IMAGING — DX DG CHEST 1V PORT
1 series · 1 of 1 positions shown · non-contrast
Comparison: None.

CLINICAL DATA: Cough.

EXAM:
PORTABLE CHEST 1 VIEW

[chest ap]
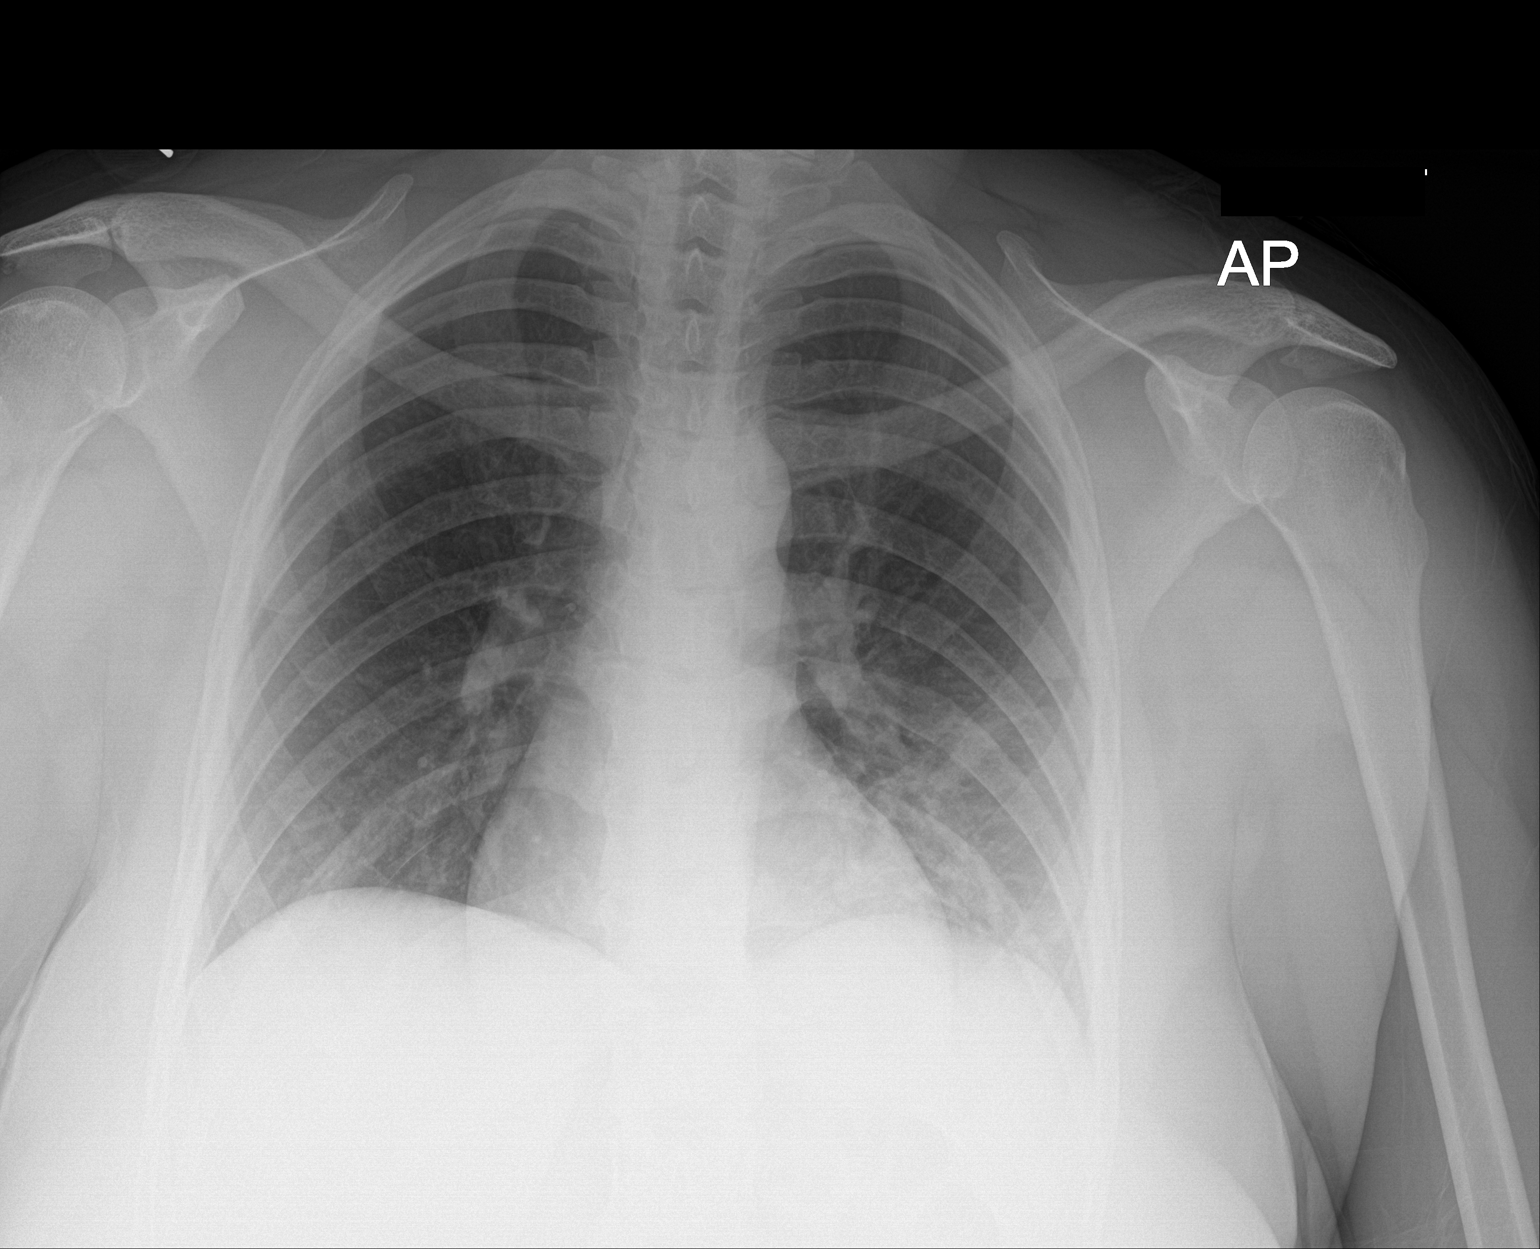

[1 of 1 positions shown; findings below may reference images not displayed]

FINDINGS: Mild infiltrate in the left base. The heart, hila, mediastinum,
lungs, and pleura are otherwise normal.
IMPRESSION: Mild infiltrate in the left base is worrisome for pneumonia given
history. Recommend short-term follow-up imaging to ensure
resolution.

## 2022-11-26 ENCOUNTER — Other Ambulatory Visit (HOSPITAL_BASED_OUTPATIENT_CLINIC_OR_DEPARTMENT_OTHER): Payer: Self-pay

## 2022-11-26 ENCOUNTER — Telehealth: Payer: Self-pay | Admitting: Physician Assistant

## 2022-11-26 DIAGNOSIS — M5432 Sciatica, left side: Secondary | ICD-10-CM

## 2022-11-26 MED ORDER — CYCLOBENZAPRINE HCL 10 MG PO TABS
5.0000 mg | ORAL_TABLET | Freq: Three times a day (TID) | ORAL | 0 refills | Status: DC | PRN
  Filled 2022-11-26: qty 30, 10d supply, fill #0

## 2022-11-26 MED ORDER — NAPROXEN 500 MG PO TABS
500.0000 mg | ORAL_TABLET | Freq: Two times a day (BID) | ORAL | 0 refills | Status: DC
  Filled 2022-11-26: qty 14, 7d supply, fill #0
  Filled 2022-11-26: qty 16, 8d supply, fill #0

## 2022-11-26 NOTE — Progress Notes (Signed)

## 2022-12-06 ENCOUNTER — Ambulatory Visit
Admission: RE | Admit: 2022-12-06 | Discharge: 2022-12-06 | Disposition: A | Discharge status: Home / Self Care | Payer: 59 | Source: Ambulatory Visit | Attending: Physician Assistant | Admitting: Physician Assistant

## 2022-12-06 VITALS — BP 148/95 | HR 80 | Temp 98.1°F | Resp 18

## 2022-12-06 DIAGNOSIS — M5432 Sciatica, left side: Secondary | ICD-10-CM | POA: Diagnosis not present

## 2022-12-06 MED ORDER — PREDNISONE 10 MG PO TABS: ORAL_TABLET | ORAL | 0 refills | Status: DC

## 2022-12-06 NOTE — Discharge Instructions (Addendum)
Return if any problems.

## 2022-12-06 NOTE — ED Triage Notes (Signed)
Pt presents with c/o left leg pain and lower back pain X 3 weeks. States she was given naproxen and muscle relaxer before. States she does not want to take flexeril anymore.

## 2022-12-10 NOTE — ED Provider Notes (Signed)
[facility]    CSN: [id number] Arrival date & time: 12/06/22  1442      History   Chief Complaint Chief Complaint  Patient presents with   Appointment    HPI Sylvia Salinas is a 42 y.o. female.   Complains of pain in her left low back and down her left leg.  Patient was recently diagnosed with sciatica to her left side patient reports that she was given a prescription for a muscle relaxer.  Patient reports she has not had any relief with the muscle relaxer and she does not want to take any more.  Patient complains of pain in her low back that goes down her left leg.     Past Medical History:  Diagnosis Date   Acute pancreatitis 11/30/2014    Patient Active Problem List   Diagnosis Date Noted   Choledocholithiasis with obstruction 12/16/2014   Calculus of bile duct with obstruction and without cholangitis or cholecystitis    Elevated liver enzymes 12/15/2014   Gallstones    Acute pancreatitis    Common bile duct dilation    Nausea with vomiting    Pancreatitis 11/30/2014    Past Surgical History:  Procedure Laterality Date   ABDOMINAL HERNIA REPAIR  2005   CESAREAN SECTION  04/2003; 01/2009   CHOLECYSTECTOMY N/A 12/02/2014   Procedure: LAPAROSCOPIC CHOLECYSTECTOMY WITH ATTEMPTED INTRAOPERATIVE CHOLANGIOGRAM;  Surgeon: Coralie Keens, MD;  Location: [facility];  Service: General;  Laterality: N/A;   ERCP N/A 12/16/2014   Procedure: ENDOSCOPIC RETROGRADE CHOLANGIOPANCREATOGRAPHY (ERCP);  Surgeon: Gatha Mayer, MD;  [facility]: [facility]COPY;  Service: Endoscopy;  Laterality: N/A;   HERNIA REPAIR  2005    OB History   No obstetric history on file.      Home Medications    Prior to Admission medications   Medication Sig Start Date End Date Taking? Authorizing Provider  predniSONE (DELTASONE) 10 MG tablet 6,5,4,3,2,1 taper 12/06/22  Yes Fransico Meadow, PA-C  cyclobenzaprine (FLEXERIL) 10 MG tablet Take 0.5-1 tablets (5-10 mg total) by mouth 3 (three) times  daily as needed. 11/26/22   Mar Daring, PA-C  naproxen (NAPROSYN) 500 MG tablet Take 1 tablet (500 mg total) by mouth 2 (two) times daily with a meal. 11/26/22   [place], Clearnce Sorrel, PA-C    Family History Family History  Problem Relation Age of Onset   Stroke Mother    Stroke Father    Liver cancer Maternal Uncle    Kidney cancer Neg Hx    Alcoholism Neg Hx     Social History Social History   Tobacco Use   Smoking status: Never   Smokeless tobacco: Never  Substance Use Topics   Alcohol use: Not Currently    Comment: 11/30/2014 "glass of wine q couple weeks"   Drug use: No     Allergies   Patient has no known allergies.   Review of Systems Review of Systems  All other systems reviewed and are negative.    Physical Exam Triage Vital Signs ED Triage Vitals  Enc Vitals Group     BP 12/06/22 1509 (!) 148/95     Pulse Rate 12/06/22 1509 80     Resp 12/06/22 1509 18     Temp 12/06/22 1509 98.1 F (36.7 C)     Temp Source 12/06/22 1509 Oral     SpO2 12/06/22 1509 98 %     Weight --      Height --  Head Circumference --      Peak Flow --      Pain Score 12/06/22 1508 8     Pain Loc --      Pain Edu? --      Excl. in [place]? --    No data found.  Updated Vital Signs BP (!) 148/95 (BP Location: Right Arm)   Pulse 80   Temp 98.1 F (36.7 C) (Oral)   Resp 18   LMP 11/22/2022 (Exact Date)   SpO2 98%   Visual Acuity Right Eye Distance:   Left Eye Distance:   Bilateral Distance:    Right Eye Near:   Left Eye Near:    Bilateral Near:     Physical Exam Vitals and nursing note reviewed.  Constitutional:      Appearance: She is well-developed.  HENT:     Head: Normocephalic.  Cardiovascular:     Rate and Rhythm: Normal rate.  Pulmonary:     Effort: Pulmonary effort is normal.  Abdominal:     General: There is no distension.  Musculoskeletal:        General: Normal range of motion.     Cervical back: Normal range of motion.  Skin:     General: Skin is warm.  Neurological:     General: No focal deficit present.     Mental Status: She is alert and oriented to person, place, and time.  Psychiatric:        Mood and Affect: Mood normal.      UC Treatments / Results  Labs (all labs ordered are listed, but only abnormal results are displayed) Labs Reviewed - No data to display  EKG   Radiology No results found.  Procedures Procedures (including critical care time)  Medications Ordered in UC Medications - No data to display  Initial Impression / Assessment and Plan / UC Course  I have reviewed the triage vital signs and the nursing notes.  Pertinent labs & imaging results that were available during my care of the patient were reviewed by me and considered in my medical decision making (see chart for details).     MDM: Will try patient on a course of prednisone.  Patient is advised to follow-up with her physician for recheck. Final Clinical Impressions(s) / UC Diagnoses   Final diagnoses:  Sciatica, left side     Discharge Instructions      Return if any problems.    ED Prescriptions     Medication Sig Dispense Auth. Provider   predniSONE (DELTASONE) 10 MG tablet 6,5,4,3,2,1 taper 21 tablet Fransico Meadow, [place]      PDMP not reviewed this encounter. An After Visit Summary was printed and given to the patient.       Fransico Meadow, [place] 12/10/22 [id number]

## 2022-12-12 DIAGNOSIS — M5442 Lumbago with sciatica, left side: Secondary | ICD-10-CM | POA: Diagnosis not present

## 2022-12-12 DIAGNOSIS — M25562 Pain in left knee: Secondary | ICD-10-CM | POA: Diagnosis not present

## 2022-12-15 ENCOUNTER — Emergency Department (HOSPITAL_COMMUNITY)
Admission: EM | Admit: 2022-12-15 | Discharge: 2022-12-15 | Disposition: A | Discharge status: Home / Self Care | Payer: 59 | Attending: Emergency Medicine | Admitting: Emergency Medicine

## 2022-12-15 ENCOUNTER — Encounter (HOSPITAL_COMMUNITY): Payer: Self-pay | Admitting: Emergency Medicine

## 2022-12-15 ENCOUNTER — Emergency Department (HOSPITAL_COMMUNITY): Payer: 59

## 2022-12-15 DIAGNOSIS — M5442 Lumbago with sciatica, left side: Secondary | ICD-10-CM | POA: Diagnosis not present

## 2022-12-15 DIAGNOSIS — M545 Low back pain, unspecified: Secondary | ICD-10-CM | POA: Diagnosis not present

## 2022-12-15 DIAGNOSIS — M5417 Radiculopathy, lumbosacral region: Secondary | ICD-10-CM

## 2022-12-15 DIAGNOSIS — R32 Unspecified urinary incontinence: Secondary | ICD-10-CM | POA: Diagnosis not present

## 2022-12-15 DIAGNOSIS — D649 Anemia, unspecified: Secondary | ICD-10-CM

## 2022-12-15 LAB — URINALYSIS, ROUTINE W REFLEX MICROSCOPIC
Bacteria, UA: NONE SEEN
Bilirubin Urine: NEGATIVE
Glucose, UA: NEGATIVE mg/dL
Ketones, ur: NEGATIVE mg/dL
Leukocytes,Ua: NEGATIVE
Nitrite: NEGATIVE
Protein, ur: NEGATIVE mg/dL
Specific Gravity, Urine: 1.024 (ref 1.005–1.030)
pH: 5 (ref 5.0–8.0)

## 2022-12-15 LAB — CBC
HCT: 28.8 % — ABNORMAL LOW (ref 36.0–46.0)
Hemoglobin: 7.6 g/dL — ABNORMAL LOW (ref 12.0–15.0)
MCH: 18.6 pg — ABNORMAL LOW (ref 26.0–34.0)
MCHC: 26.4 g/dL — ABNORMAL LOW (ref 30.0–36.0)
MCV: 70.6 fL — ABNORMAL LOW (ref 80.0–100.0)
Platelets: 477 10*3/uL — ABNORMAL HIGH (ref 150–400)
RBC: 4.08 MIL/uL (ref 3.87–5.11)
RDW: 18.5 % — ABNORMAL HIGH (ref 11.5–15.5)
WBC: 10.9 10*3/uL — ABNORMAL HIGH (ref 4.0–10.5)
nRBC: 0 % (ref 0.0–0.2)

## 2022-12-15 LAB — COMPREHENSIVE METABOLIC PANEL
ALT: 15 U/L (ref 0–44)
AST: 18 U/L (ref 15–41)
Albumin: 3.3 g/dL — ABNORMAL LOW (ref 3.5–5.0)
Alkaline Phosphatase: 48 U/L (ref 38–126)
Anion gap: 8 (ref 5–15)
BUN: 12 mg/dL (ref 6–20)
CO2: 25 mmol/L (ref 22–32)
Calcium: 8.3 mg/dL — ABNORMAL LOW (ref 8.9–10.3)
Chloride: 104 mmol/L (ref 98–111)
Creatinine, Ser: 0.74 mg/dL (ref 0.44–1.00)
GFR, Estimated: >60 mL/min (ref >60)
Glucose, Bld: 118 mg/dL — ABNORMAL HIGH (ref 70–99)
Potassium: 3.4 mmol/L — ABNORMAL LOW (ref 3.5–5.1)
Sodium: 137 mmol/L (ref 135–145)
Total Bilirubin: 0.5 mg/dL (ref 0.3–1.2)
Total Protein: 6.7 g/dL (ref 6.5–8.1)

## 2022-12-15 MED ORDER — PANTOPRAZOLE SODIUM 20 MG PO TBEC: 20.0000 mg | DELAYED_RELEASE_TABLET | Freq: Every day | ORAL | 1 refills | Status: DC

## 2022-12-15 MED ORDER — FERROUS SULFATE 325 (65 FE) MG PO TABS: 325.0000 mg | ORAL_TABLET | Freq: Every day | ORAL | 1 refills | Status: DC

## 2022-12-15 MED ORDER — OXYCODONE HCL 5 MG PO TABS: 5.0000 mg | ORAL_TABLET | Freq: Four times a day (QID) | ORAL | 0 refills | Status: DC | PRN

## 2022-12-15 MED ORDER — ONDANSETRON HCL 4 MG/2ML IJ SOLN
4.0000 mg | Freq: Once | INTRAMUSCULAR | Status: AC
  Administered 2022-12-15: 4 mg via INTRAVENOUS
  Filled 2022-12-15: qty 2

## 2022-12-15 MED ORDER — POLYETHYLENE GLYCOL 3350 17 G PO PACK: 17.0000 g | PACK | Freq: Every day | ORAL | 0 refills | Status: DC

## 2022-12-15 MED ORDER — HYDROMORPHONE HCL 1 MG/ML IJ SOLN
1.0000 mg | Freq: Once | INTRAMUSCULAR | Status: AC
  Administered 2022-12-15: 1 mg via INTRAVENOUS
  Filled 2022-12-15: qty 1

## 2022-12-15 NOTE — ED Provider Notes (Signed)
Clinical Course as of 12/15/22 1103  Sun Dec 15, 2022  0709 41 F with worsening left sided sciatic pain. Decreased strength 2/2 pain. Chronic IDA, Hgb 7.6. Follow up MRI.  [VB]  [id number] Pt has been resting comfortably in her room in bed. [VB]  [id number] Patient's MRI resulted with evidence of S1 impingement which is consistent with her sciatica pain.  She has been ambulatory to the bathroom.  She is currently on menstrual period.  She has asymptomatic anemia.  I discussed the importance of starting iron pills and referred to hematology for suspected iron deficiency anemia workup.  Also discharging with breakthrough pain oxycodone.  She is currently taking Mobic and gabapentin.  Will provide referral to spine surgeon.  Return precaution discussed.  She is being discharged in good condition. [VB]    Clinical Course User Index [VB] Elgie Congo, MD      Elgie Congo, MD 12/15/22 [phone]

## 2022-12-15 NOTE — ED Notes (Signed)
Pt ambulated independently to bathroom with slight limp but no need for assistance

## 2022-12-15 NOTE — Discharge Instructions (Addendum)
You should be called by the hematology group for further workup of your anemia.  We do think this related to iron deficiency.  Take the iron pills.  Take the MiraLAX to prevent constipation.  Your MRI did show a pinched nerve in S1 which is causing your pain.  Keep taking your Mobic with Protonix and the gabapentin as prescribed.  For severe breakthrough pain you can take the oxycodone but do not take while drinking alcohol or driving a vehicle.  This medicine can make you sleepy.  Make an appointment with the spine surgeons listed in your discharge paperwork for follow-up and further management.  Come back if any severe uncontrolled pain, inability to walk, peeing or pooping on yourself, loss of sensation in your groin, or any other symptoms concerning to you.

## 2022-12-15 NOTE — ED Provider Notes (Signed)
[facility] Emergency Department Provider Note MRN:  [id number]  Arrival date & time: 12/15/22     Chief Complaint   Leg Pain   History of Present Illness   Sylvia Salinas is a 42 y.o. year-old female with no prior past medical history presenting to the ED with chief complaint of leg pain.  Left lower back/buttocks pain with radiation down the back of the left leg for the past 3 weeks.  Getting worse and worse, severe this evening.  And today having urinary incontinence.  No fever.  No numbness or weakness.  Review of Systems  A thorough review of systems was obtained and all systems are negative except as noted in the HPI and PMH.   Patient's Health History    Past Medical History:  Diagnosis Date   Acute pancreatitis 11/30/2014    Past Surgical History:  Procedure Laterality Date   ABDOMINAL HERNIA REPAIR  2005   CESAREAN SECTION  04/2003; 01/2009   CHOLECYSTECTOMY N/A 12/02/2014   Procedure: LAPAROSCOPIC CHOLECYSTECTOMY WITH ATTEMPTED INTRAOPERATIVE CHOLANGIOGRAM;  Surgeon: Coralie Keens, MD;  Location: [facility];  Service: General;  Laterality: N/A;   ERCP N/A 12/16/2014   Procedure: ENDOSCOPIC RETROGRADE CHOLANGIOPANCREATOGRAPHY (ERCP);  Surgeon: Gatha Mayer, MD;  Location: [facility] END[facility]COPY;  Service: Endoscopy;  Laterality: N/A;   HERNIA REPAIR  2005    Family History  Problem Relation Age of Onset   Stroke Mother    Stroke Father    Liver cancer Maternal Uncle    Kidney cancer Neg Hx    Alcoholism Neg Hx     Social History   Socioeconomic History   Marital status: Single    Spouse name: Not on file   Number of children: Not on file   Years of education: Not on file   Highest education level: Not on file  Occupational History   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   Tobacco Use   Smoking status: Never   Smokeless tobacco: Never  Substance and Sexual Activity   Alcohol  use: Not Currently    Comment: 11/30/2014 "glass of wine q couple weeks"   Drug use: No   Sexual activity: Yes  Other Topics Concern   Not on file  Social History Narrative   Moved to [place] from [place] in 2015.     Social Determinants of Health   Financial Resource Strain: Not on file  Food Insecurity: Not on file  Transportation Needs: Not on file  Physical Activity: Not on file  Stress: Not on file  Social Connections: Not on file  Intimate Partner Violence: Not on file     Physical Exam   Vitals:   12/15/22 0217 12/15/22 0430  BP: (!) 158/93 135/78  Pulse: 93 74  Resp: 18   Temp: 97.6 F (36.4 C)   SpO2: 100% 100%    CONSTITUTIONAL: Well-appearing, NAD NEURO/PSYCH:  Alert and oriented x 3, no focal deficits EYES:  eyes equal and reactive ENT/NECK:  no LAD, no JVD CARDIO: Regular rate, well-perfused, normal S1 and S2 PULM:  CTAB no wheezing or rhonchi GI/GU:  non-distended, non-tender MSK/SPINE:  No gross deformities, no edema SKIN:  no rash, atraumatic   *Additional and/or pertinent findings included in MDM below  Diagnostic and Interventional Summary    EKG Interpretation  Date/Time:    Ventricular Rate:    PR Interval:    QRS Duration:  QT Interval:    QTC Calculation:   R Axis:     Text Interpretation:         Labs Reviewed  CBC - Abnormal; Notable for the following components:      Result Value   WBC 10.9 (*)    Hemoglobin 7.6 (*)    HCT 28.8 (*)    MCV 70.6 (*)    MCH 18.6 (*)    MCHC 26.4 (*)    RDW 18.5 (*)    Platelets 477 (*)    All other components within normal limits  COMPREHENSIVE METABOLIC PANEL - Abnormal; Notable for the following components:   Potassium 3.4 (*)    Glucose, Bld 118 (*)    Calcium 8.3 (*)    Albumin 3.3 (*)    All other components within normal limits  URINALYSIS, ROUTINE W REFLEX MICROSCOPIC    MR LUMBAR SPINE WO CONTRAST    (Results Pending)    Medications  HYDROmorphone (DILAUDID) injection 1 mg  (1 mg Intravenous Given 12/15/22 0419)  ondansetron (ZOFRAN) injection 4 mg (4 mg Intravenous Given 12/15/22 0553)     Procedures  /  Critical Care Procedures  ED Course and Medical Decision Making  Initial Impression and Ddx Suspicion for cauda equina given the urinary incontinence with worsening pain.  Will need MRI.  Past medical/surgical history that increases complexity of ED encounter: None  Interpretation of Diagnostics I personally reviewed the laboratory assessment and my interpretation is as follows: Anemia noted though comparison is several years ago and with low MCV suspect chronic iron deficiency.  MRI pending  Patient Reassessment and Ultimate Disposition/Management     Signed out to oncoming provider at shift change.  Patient management required discussion with the following services or consulting groups:  None  Complexity of Problems Addressed Acute illness or injury that poses threat of life of bodily function  Additional Data Reviewed and Analyzed Further history obtained from: None  Additional Factors Impacting ED Encounter Risk Consideration of hospitalization  Barth Kirks. [place] Small, [place] [email or web address]  Final Clinical Impressions(s) / ED Diagnoses     ICD-10-CM   1. Acute left-sided low back pain with left-sided sciatica  M54.42     2. Urinary incontinence, unspecified type  R32       ED Discharge Orders     None        Discharge Instructions Discussed with and Provided to Patient:   Discharge Instructions   None      Maudie Flakes, MD 12/15/22 [phone]

## 2022-12-15 NOTE — ED Triage Notes (Signed)
Pt states she has seen 3 doctors for newly dx leg pain. Told degenerative disc disease. Px muscle relaxer and gabapentin. States this morning she is urinating without realizing it and pain is not getting better. Denies fecal incontinence.

## 2022-12-15 NOTE — ED Notes (Signed)
Pt transported to MRI 

## 2022-12-16 DIAGNOSIS — M5116 Intervertebral disc disorders with radiculopathy, lumbar region: Secondary | ICD-10-CM | POA: Diagnosis not present

## 2022-12-16 DIAGNOSIS — M9903 Segmental and somatic dysfunction of lumbar region: Secondary | ICD-10-CM | POA: Diagnosis not present

## 2022-12-16 DIAGNOSIS — M25552 Pain in left hip: Secondary | ICD-10-CM | POA: Diagnosis not present

## 2022-12-16 DIAGNOSIS — M9905 Segmental and somatic dysfunction of pelvic region: Secondary | ICD-10-CM | POA: Diagnosis not present

## 2022-12-18 ENCOUNTER — Telehealth: Payer: Self-pay | Admitting: Hematology and Oncology

## 2022-12-18 NOTE — Telephone Encounter (Signed)
scheduled per refrral , pt has been called and confirmed date and time. Pt is aware of location and to arrive early for check in

## 2022-12-20 DIAGNOSIS — Z6828 Body mass index (BMI) 28.0-28.9, adult: Secondary | ICD-10-CM | POA: Diagnosis not present

## 2022-12-20 DIAGNOSIS — M5416 Radiculopathy, lumbar region: Secondary | ICD-10-CM | POA: Diagnosis not present

## 2022-12-31 DIAGNOSIS — M5416 Radiculopathy, lumbar region: Secondary | ICD-10-CM | POA: Diagnosis not present

## 2023-01-03 ENCOUNTER — Ambulatory Visit (INDEPENDENT_AMBULATORY_CARE_PROVIDER_SITE_OTHER): Payer: 59 | Admitting: Primary Care

## 2023-01-03 ENCOUNTER — Encounter (INDEPENDENT_AMBULATORY_CARE_PROVIDER_SITE_OTHER): Payer: Self-pay | Admitting: Primary Care

## 2023-01-03 VITALS — BP 149/95 | HR 88 | Resp 16 | Ht 64.0 in | Wt 180.2 lb

## 2023-01-03 DIAGNOSIS — Z7689 Persons encountering health services in other specified circumstances: Secondary | ICD-10-CM

## 2023-01-03 DIAGNOSIS — Z6830 Body mass index (BMI) 30.0-30.9, adult: Secondary | ICD-10-CM

## 2023-01-03 DIAGNOSIS — D509 Iron deficiency anemia, unspecified: Secondary | ICD-10-CM

## 2023-01-03 DIAGNOSIS — M5432 Sciatica, left side: Secondary | ICD-10-CM

## 2023-01-03 DIAGNOSIS — E6609 Other obesity due to excess calories: Secondary | ICD-10-CM

## 2023-01-03 DIAGNOSIS — G894 Chronic pain syndrome: Secondary | ICD-10-CM | POA: Diagnosis not present

## 2023-01-03 MED ORDER — FERROUS SULFATE 325 (65 FE) MG PO TABS: 325.0000 mg | ORAL_TABLET | Freq: Every day | ORAL | 1 refills | Status: DC

## 2023-01-03 NOTE — Progress Notes (Unsigned)
New Patient Office Visit  Subjective    Patient ID: Sylvia Salinas, female    DOB: 01-10-1981  Age: 42 y.o. MRN: [id number]  CC: Establish care   HPI Sylvia Salinas 42 year old Body mass index is 30.93 kg/m.  Obese female who presents to establish care after a ED visit on 12/15/22 Acute left-sided low back pain with left-sided sciatica, Urinary incontinence, unspecified type, Anemia, unspecified type and Lumbosacral radiculopathy at S1. She is requesting a referral to pain clinic . She has been out of work since 4 weeks per neurology. after an injection.    Outpatient Encounter Medications as of 01/03/2023  Medication Sig   cyclobenzaprine (FLEXERIL) 10 MG tablet Take 0.5-1 tablets (5-10 mg total) by mouth 3 (three) times daily as needed.   ferrous sulfate 325 (65 FE) MG tablet Take 1 tablet (325 mg total) by mouth daily.   naproxen (NAPROSYN) 500 MG tablet Take 1 tablet (500 mg total) by mouth 2 (two) times daily with a meal.   oxyCODONE (ROXICODONE) 5 MG immediate release tablet Take 1 tablet (5 mg total) by mouth every 6 (six) hours as needed for up to 10 doses for severe pain.   pantoprazole (PROTONIX) 20 MG tablet Take 1 tablet (20 mg total) by mouth daily.   polyethylene glycol (MIRALAX) 17 g packet Take 17 g by mouth daily.   predniSONE (DELTASONE) 10 MG tablet 6,5,4,3,2,1 taper   No facility-administered encounter medications on file as of 01/03/2023.    Past Medical History:  Diagnosis Date   Acute pancreatitis 11/30/2014    Past Surgical History:  Procedure Laterality Date   ABDOMINAL HERNIA REPAIR  2005   CESAREAN SECTION  04/2003; 01/2009   CHOLECYSTECTOMY N/A 12/02/2014   Procedure: LAPAROSCOPIC CHOLECYSTECTOMY WITH ATTEMPTED INTRAOPERATIVE CHOLANGIOGRAM;  Surgeon: Abigail Miyamoto, MD;  Location: [facility] OR;  Service: General;  Laterality: N/A;   ERCP N/A 12/16/2014   Procedure: ENDOSCOPIC RETROGRADE CHOLANGIOPANCREATOGRAPHY (ERCP);  Surgeon: Iva Boop, MD;   Location: [facility] ENDOSCOPY;  Service: Endoscopy;  Laterality: N/A;   HERNIA REPAIR  2005    Family History  Problem Relation Age of Onset   Stroke Mother    Stroke Father    Liver cancer Maternal Uncle    Kidney cancer Neg Hx    Alcoholism Neg Hx     Social History   Socioeconomic History   Marital status: Single    Spouse name: Not on file   Number of children: Not on file   Years of education: Not on file   Highest education level: Not on file  Occupational History   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   Tobacco Use   Smoking status: Never   Smokeless tobacco: Never  Substance and Sexual Activity   Alcohol use: Not Currently    Comment: 11/30/2014 "glass of wine q couple weeks"   Drug use: No   Sexual activity: Yes  Other Topics Concern   Not on file  Social History Narrative   Moved to [place] from [place] in 2015.     Social Determinants of Health   Financial Resource Strain: Not on file  Food Insecurity: Not on file  Transportation Needs: Not on file  Physical Activity: Not on file  Stress: Not on file  Social Connections: Not on file  Intimate Partner Violence: Not on file    ROS      Objective  Last Menstrual Period 11/22/2022 (Exact Date)   Physical Exam  {Labs (Optional):23779}    Assessment & Plan:  Sylvia Salinas was seen today for new patient (initial visit) and hospitalization follow-up.  Diagnoses and all orders for this visit  Sciatica of left side Follow up with [facility]   Chronic pain syndrome  Encounter to establish care  Class 1 obesity due to excess calories with serious comorbidity and body mass index (BMI) of 30.0 to 30.9 in adult .mebm   Problem List Items Addressed This Visit   None   No follow-ups on file.   Grayce Sessions, NP

## 2023-01-03 NOTE — Patient Instructions (Signed)
Obesity, Adult Obesity is the condition of having too much total body fat. Being overweight or obese means that your weight is greater than what is considered healthy for your body size. Obesity is determined by a measurement called BMI (body mass index). BMI is an estimate of body fat and is calculated from height and weight. For adults, a BMI of 30 or higher is considered obese. Obesity can lead to other health concerns and major illnesses, including: Stroke. Coronary artery disease (CAD). Type 2 diabetes. Some types of cancer, including cancers of the colon, breast, uterus, and gallbladder. High blood pressure (hypertension). High cholesterol. Gallbladder stones. Obesity can also contribute to: Osteoarthritis. Sleep apnea. Infertility problems. What are the causes? Common causes of this condition include: Eating daily meals that are high in calories, sugar, and fat. Drinking high amounts of sugar-sweetened beverages, such as soft drinks. Being born with genes that may make you more likely to become obese. Having a medical condition that causes obesity, including: Hypothyroidism. Polycystic ovarian syndrome (PCOS). Binge-eating disorder. Cushing syndrome. Taking certain medicines, such as steroids, antidepressants, and seizure medicines. Not being physically active (sedentary lifestyle). Not getting enough sleep. What increases the risk? The following factors may make you more likely to develop this condition: Having a family history of obesity. Living in an area with limited access to: [place], recreation centers, or sidewalks. Healthy food choices, such as grocery stores and farmers' markets. What are the signs or symptoms? The main sign of this condition is having too much body fat. How is this diagnosed? This condition is diagnosed based on: Your BMI. If you are an adult with a BMI of 30 or higher, you are considered obese. Your waist circumference. This measures the  distance around your waistline. Your skinfold thickness. Your health care provider may gently pinch a fold of your skin and measure it. You may have other tests to check for underlying conditions. How is this treated? Treatment for this condition often includes changing your lifestyle. Treatment may include some or all of the following: Dietary changes. This may include developing a healthy meal plan. Regular physical activity. This may include activity that causes your heart to beat faster (aerobic exercise) and strength training. Work with your health care provider to design an exercise program that works for you. Medicine to help you lose weight if you are unable to lose one pound a week after six weeks of healthy eating and more physical activity. Treating conditions that cause the obesity (underlying conditions). Surgery. Surgical options may include gastric banding and gastric bypass. Surgery may be done if: Other treatments have not helped to improve your condition. You have a BMI of 40 or higher. You have life-threatening health problems related to obesity. Follow these instructions at home: Eating and drinking  Follow recommendations from your health care provider about what you eat and drink. Your health care provider may advise you to: Limit fast food, sweets, and processed snack foods. Choose low-fat options, such as low-fat milk instead of whole milk. Eat five or more servings of fruits or vegetables every day. Choose healthy foods when you eat out. Keep low-fat snacks available. Limit sugary drinks, such as soda, fruit juice, sweetened iced tea, and flavored milk. Drink enough water to keep your urine pale yellow. Do not follow a fad diet. Fad diets can be unhealthy and even dangerous. Other healthful choices include: Eat at home more often. This gives you more control over what you eat. Learn to read food labels.  This will help you understand how much food is considered one  serving. Learn what a healthy serving size is. Physical activity Exercise regularly, as told by your health care provider. Most adults should get up to 150 minutes of moderate-intensity exercise every week. Ask your health care provider what types of exercise are safe for you and how often you should exercise. Warm up and stretch before being active. Cool down and stretch after being active. Rest between periods of activity. Lifestyle Work with your health care provider and a dietitian to set a weight-loss goal that is healthy and reasonable for you. Limit your screen time. Find ways to reward yourself that do not involve food. Do not drink alcohol if: Your health care provider tells you not to drink. You are pregnant, may be pregnant, or are planning to become pregnant. If you drink alcohol: Limit how much you have to: 0-1 drink a day for women. 0-2 drinks a day for men. Know how much alcohol is in your drink. In the [place] one drink equals one 12 oz bottle of beer (355 mL), one 5 oz glass of wine (148 mL), or one 1 oz glass of hard liquor (44 mL). General instructions Keep a weight-loss journal to keep track of the food you eat and how much exercise you get. Take over-the-counter and prescription medicines only as told by your health care provider. Take vitamins and supplements only as told by your health care provider. Consider joining a support group. Your health care provider may be able to recommend a support group. Pay attention to your mental health as obesity can lead to depression or self esteem issues. Keep all follow-up visits. This is important. Contact a health care provider if: You are unable to meet your weight-loss goal after six weeks of dietary and lifestyle changes. You have trouble breathing. Summary Obesity is the condition of having too much total body fat. Being overweight or obese means that your weight is greater than what is considered healthy for your body  size. Work with your health care provider and a dietitian to set a weight-loss goal that is healthy and reasonable for you. Exercise regularly, as told by your health care provider. Ask your health care provider what types of exercise are safe for you and how often you should exercise. This information is not intended to replace advice given to you by your health care provider. Make sure you discuss any questions you have with your health care provider. Document Revised: 04/10/2021 Document Reviewed: 04/10/2021 Elsevier Patient Education  2023 Elsevier Inc. Chronic Pain, Adult Chronic pain is a type of pain that lasts or keeps coming back for at least 3-6 months. You may have headaches, pain in the abdomen, or pain in other areas of the body. Chronic pain may be related to an illness, injury, or a health condition. Sometimes, the cause of chronic pain is not known. Chronic pain can make it hard for you to do daily activities. If it is not treated, chronic pain can lead to anxiety and depression. Treatment depends on the cause of your pain and how severe it is. You may need to work with a pain specialist to come up with a treatment plan. Many people benefit from two or more types of treatment to control their pain. Follow these instructions at home: Treatment plan Follow your treatment plan as told by your health care provider. This may include: Gentle, regular exercise. Eating a healthy diet that includes foods such as  vegetables, fruits, fish, and lean meats. Mental health therapy (cognitive or behavioral therapy) that changes the way you think or act in response to the pain. This may help improve how you feel. Doing physical therapy exercises to improve movement and strength. Meditation, yoga, acupuncture, or massage therapy. Using the oils from plants in your environment or on your skin (aromatherapy). Other treatments may include: Over-the-counter or prescription medicines. Color, light, or  sound therapy. Local electrical stimulation. The electrical pulses help to relieve pain by temporarily stopping the nerve impulses that cause you to feel pain. Injections. These deliver numbing or pain-relieving medicines into the spine or the area of pain.  Medicines Take over-the-counter and prescription medicines only as told by your health care provider. Ask your health care provider if the medicine prescribed to you: Requires you to avoid driving or using machinery. Can cause constipation. You may need to take these actions to prevent or treat constipation: Drink enough fluid to keep your urine pale yellow. Take over-the-counter or prescription medicines. Eat foods that are high in fiber, such as beans, whole grains, and fresh fruits and vegetables. Limit foods that are high in fat and processed sugars, such as fried or sweet foods. Lifestyle  Ask your health care provider whether you should keep a pain diary. Your health care provider will tell you what information to write in the diary. This may include: When you have pain. What the pain feels like. How medicines and other behaviors or treatments help to reduce the pain. Consider talking with a mental health care provider about how to help manage chronic pain. Consider joining a chronic pain support group. Try to control or lower your stress levels. Talk with your health care provider about ways to do this. General instructions Learn as much as you can about how to manage your chronic pain. Ask your health care provider if an intensive pain rehabilitation program or a chronic pain specialist would be helpful. Check your pain level as told by your health care provider. Ask your health care provider if you should use a pain scale. Contact a health care provider if: Your pain is not controlled with treatment. You have new pain. You have side effects from pain medicine. You feel weak or you have trouble doing your normal  activities. You have trouble sleeping or you develop confusion. You lose feeling or have numbness in your body. You lose control of your bowels or bladder. Get help right away if: Your pain suddenly gets much worse. You develop chest pain. You have trouble breathing or shortness of breath. You faint, or another person sees you faint. These symptoms may be an emergency. Get help right away. Call 911. Do not wait to see if the symptoms will go away. Do not drive yourself to the hospital. Also, get help right away if: You have thoughts about hurting yourself or others. Take one of these steps if you feel like you may hurt yourself or others, or have thoughts about taking your own life: Go to your nearest emergency room. Call 911. Call the National Suicide Prevention Lifeline at 762-465-1392 or 988. This is open 24 hours a day. Text the Crisis Text Line at 801-560-5237. This information is not intended to replace advice given to you by your health care provider. Make sure you discuss any questions you have with your health care provider. Document Revised: 04/24/2022 Document Reviewed: 03/27/2022 Elsevier Patient Education  2023 [employer].

## 2023-01-06 ENCOUNTER — Inpatient Hospital Stay: Payer: 59

## 2023-01-06 ENCOUNTER — Inpatient Hospital Stay: Payer: 59 | Admitting: Hematology and Oncology

## 2023-01-06 ENCOUNTER — Encounter: Payer: Self-pay | Admitting: *Deleted

## 2023-01-06 DIAGNOSIS — D509 Iron deficiency anemia, unspecified: Secondary | ICD-10-CM | POA: Insufficient documentation

## 2023-01-06 NOTE — Progress Notes (Signed)
Per MD request, new pt appt canceled today due to pt being a no show.

## 2023-01-06 NOTE — Assessment & Plan Note (Deleted)
Lab review: 01/06/2015: Hemoglobin 11, MCV 84.4 12/15/2022: Hemoglobin 7.6, MCV 70.6, platelets 477, creatinine 0.74, calcium 8.3, albumin 3.3, LFTs normal  Iron deficiency anemia: I discussed with the patient the process of iron absorption. I counseled extensively regarding the different causes of iron deficiency including blood loss and malabsorption. Patient has had upper endoscopies and colonoscopies and did not have any clear identified source of blood loss. It is possible that the patient may still have an occult source of bleeding. However malabsorption is also a possibility.   Recommendation: 1. Stop oral iron 2. check iron studies and if she is iron deficient: Proceed with IV iron infusions  I discussed with the patient that potentially there may be a need for additional IV iron infusions if the iron levels were to remain low in the future. The frequency of need of IV iron would depend on many other factors including the rate of loss and by the degree of absorption.  Return to clinic in 3 months with recheck on iron studies and hemoglobin.

## 2023-01-10 ENCOUNTER — Encounter: Payer: Self-pay | Admitting: Physical Medicine & Rehabilitation

## 2023-01-29 ENCOUNTER — Other Ambulatory Visit: Payer: Self-pay

## 2023-01-29 DIAGNOSIS — D509 Iron deficiency anemia, unspecified: Secondary | ICD-10-CM

## 2023-01-29 DIAGNOSIS — R748 Abnormal levels of other serum enzymes: Secondary | ICD-10-CM

## 2023-01-31 ENCOUNTER — Other Ambulatory Visit: Payer: Self-pay

## 2023-01-31 ENCOUNTER — Inpatient Hospital Stay: Payer: 59 | Attending: Hematology and Oncology | Admitting: Hematology

## 2023-01-31 ENCOUNTER — Encounter: Payer: Self-pay | Admitting: Hematology

## 2023-01-31 ENCOUNTER — Inpatient Hospital Stay: Payer: 59

## 2023-01-31 VITALS — BP 132/84 | HR 90 | Temp 98.5°F | Resp 18 | Ht 65.0 in | Wt 172.4 lb

## 2023-01-31 DIAGNOSIS — Z801 Family history of malignant neoplasm of trachea, bronchus and lung: Secondary | ICD-10-CM | POA: Diagnosis not present

## 2023-01-31 DIAGNOSIS — Z823 Family history of stroke: Secondary | ICD-10-CM | POA: Diagnosis not present

## 2023-01-31 DIAGNOSIS — D509 Iron deficiency anemia, unspecified: Secondary | ICD-10-CM

## 2023-01-31 DIAGNOSIS — Z9049 Acquired absence of other specified parts of digestive tract: Secondary | ICD-10-CM

## 2023-01-31 DIAGNOSIS — Z832 Family history of diseases of the blood and blood-forming organs and certain disorders involving the immune mechanism: Secondary | ICD-10-CM

## 2023-01-31 DIAGNOSIS — N920 Excessive and frequent menstruation with regular cycle: Secondary | ICD-10-CM | POA: Diagnosis not present

## 2023-01-31 DIAGNOSIS — Z79899 Other long term (current) drug therapy: Secondary | ICD-10-CM

## 2023-01-31 DIAGNOSIS — Z800 Family history of malignant neoplasm of digestive organs: Secondary | ICD-10-CM | POA: Diagnosis not present

## 2023-01-31 DIAGNOSIS — D649 Anemia, unspecified: Secondary | ICD-10-CM | POA: Diagnosis not present

## 2023-01-31 DIAGNOSIS — R5383 Other fatigue: Secondary | ICD-10-CM

## 2023-01-31 DIAGNOSIS — D500 Iron deficiency anemia secondary to blood loss (chronic): Secondary | ICD-10-CM

## 2023-01-31 DIAGNOSIS — R748 Abnormal levels of other serum enzymes: Secondary | ICD-10-CM

## 2023-01-31 LAB — CBC WITH DIFFERENTIAL/PLATELET
Abs Immature Granulocytes: 0.02 10*3/uL (ref 0.00–0.07)
Basophils Absolute: 0 10*3/uL (ref 0.0–0.1)
Basophils Relative: 0 %
Eosinophils Absolute: 0.1 10*3/uL (ref 0.0–0.5)
Eosinophils Relative: 1 %
HCT: 25.1 % — ABNORMAL LOW (ref 36.0–46.0)
Hemoglobin: 7.3 g/dL — ABNORMAL LOW (ref 12.0–15.0)
Immature Granulocytes: 0 %
Lymphocytes Relative: 27 %
Lymphs Abs: 1.9 10*3/uL (ref 0.7–4.0)
MCH: 24.4 pg — ABNORMAL LOW (ref 26.0–34.0)
MCHC: 29.1 g/dL — ABNORMAL LOW (ref 30.0–36.0)
MCV: 83.9 fL (ref 80.0–100.0)
Monocytes Absolute: 0.6 10*3/uL (ref 0.1–1.0)
Monocytes Relative: 8 %
Neutro Abs: 4.6 10*3/uL (ref 1.7–7.7)
Neutrophils Relative %: 64 %
Platelets: 275 10*3/uL (ref 150–400)
RBC: 2.99 MIL/uL — ABNORMAL LOW (ref 3.87–5.11)
RDW: 24.1 % — ABNORMAL HIGH (ref 11.5–15.5)
WBC: 7.2 10*3/uL (ref 4.0–10.5)
nRBC: 0 % (ref 0.0–0.2)

## 2023-01-31 LAB — CMP (CANCER CENTER ONLY)
ALT: 53 U/L — ABNORMAL HIGH (ref 0–44)
AST: 34 U/L (ref 15–41)
Albumin: 3.9 g/dL (ref 3.5–5.0)
Alkaline Phosphatase: 47 U/L (ref 38–126)
Anion gap: 6 (ref 5–15)
BUN: 8 mg/dL (ref 6–20)
CO2: 26 mmol/L (ref 22–32)
Calcium: 8.6 mg/dL — ABNORMAL LOW (ref 8.9–10.3)
Chloride: 106 mmol/L (ref 98–111)
Creatinine: 0.78 mg/dL (ref 0.44–1.00)
GFR, Estimated: >60 mL/min (ref >60)
Glucose, Bld: 94 mg/dL (ref 70–99)
Potassium: 3.7 mmol/L (ref 3.5–5.1)
Sodium: 138 mmol/L (ref 135–145)
Total Bilirubin: 0.3 mg/dL (ref 0.3–1.2)
Total Protein: 6.8 g/dL (ref 6.5–8.1)

## 2023-01-31 LAB — RETIC PANEL
Immature Retic Fract: 35.3 % — ABNORMAL HIGH (ref 2.3–15.9)
RBC.: 2.96 MIL/uL — ABNORMAL LOW (ref 3.87–5.11)
Retic Count, Absolute: 95.9 10*3/uL (ref 19.0–186.0)
Retic Ct Pct: 3.2 % — ABNORMAL HIGH (ref 0.4–3.1)
Reticulocyte Hemoglobin: 27.4 pg — ABNORMAL LOW (ref >27.9)

## 2023-01-31 LAB — FERRITIN: Ferritin: 15 ng/mL (ref 11–307)

## 2023-01-31 LAB — VITAMIN B12: Vitamin B-12: 644 pg/mL (ref 180–914)

## 2023-01-31 NOTE — Progress Notes (Unsigned)
[facility]   [phone] [phone]   Clinic New Consult Note   Patient Care Team: Patient, No Pcp Per as PCP - General (General Practice)  Date of Service:  02/01/2023   CHIEF COMPLAINTS/PURPOSE OF CONSULTATION:  anemia  REFERRING PHYSICIAN:  Vivien Rossetti C,MD  HISTORY OF PRESENTING ILLNESS:  Sylvia Salinas 42 y.o. female is a here because of Anemia. The patient was referred by Vivien Rossetti C,MD. The patient presents to the clinic today alone.  Patient presented to emergency room on March 22, and again December 15, 2022 for left-sided sciatic pain.  Lab obtained during her ED visit, CBC showed hemoglobin 7.6, hematocrit 28.8%, MCV 70.6, platelet 477.  According to epic records, she had normal CBC in 2016, and a mild anemia with hemoglobin 11.6 after cholecystectomy.  MCV was previously normal.  Pt state d that this is her first time being anemic. Pt state that she has lost 50 lbs within 1 year which was intentional.    Pt state that her menstrual cycle has been on for two weeks and her norm is 5 days.   Today the patient notes she has mild fatigue, no significant dyspnea, chest pain, or other complaints.  Pt stated she use to eat a cup of ice and peppermint. Pt is also taken Iron supplement    She has a PMHx of.... Gall bladder C-section Mom-Lung Cancer  Socially... Single 2 children   REVIEW OF SYSTEMS:   Constitutional: (-)Denies fevers, chills or abnormal night sweats Eyes: (-) Denies blurriness of vision, double vision or watery eyes Ears, nose, mouth, throat, and face: Denies mucositis or sore throat Respiratory: (-) Denies cough, dyspnea or wheezes Cardiovascular:(-)  Denies palpitation, chest discomfort or lower extremity swelling Gastrointestinal: (-)  Denies nausea, heartburn or change in bowel habits Skin: (-) Denies abnormal skin rashes Lymphatics:(-) Denies new lymphadenopathy or easy bruising Neurological(-)  :Denies numbness, tingling or new weaknesses Behavioral/Psych: (-)Mood is stable, no new changes  All other systems were reviewed with the patient and are negative.   MEDICAL HISTORY:  Past Medical History:  Diagnosis Date   Acute pancreatitis 11/30/2014    SURGICAL HISTORY: Past Surgical History:  Procedure Laterality Date   ABDOMINAL HERNIA REPAIR  2005   CESAREAN SECTION  04/2003; 01/2009   CHOLECYSTECTOMY N/A 12/02/2014   Procedure: LAPAROSCOPIC CHOLECYSTECTOMY WITH ATTEMPTED INTRAOPERATIVE CHOLANGIOGRAM;  Surgeon: Abigail Miyamoto, MD;  Location: [facility] OR;  Service: General;  Laterality: N/A;   ERCP N/A 12/16/2014   Procedure: ENDOSCOPIC RETROGRADE CHOLANGIOPANCREATOGRAPHY (ERCP);  Surgeon: Iva Boop, MD;  Location: [facility] ENDOSCOPY;  Service: Endoscopy;  Laterality: N/A;   HERNIA REPAIR  2005    SOCIAL HISTORY: Social History   Socioeconomic History   Marital status: Single    Spouse name: Not on file   Number of children: 2   Years of education: Not on file   Highest education level: Not on file  Occupational History   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   Tobacco Use   Smoking status: Never   Smokeless tobacco: Never  Substance and Sexual Activity   Alcohol use: Not Currently    Comment: 11/30/2014 "glass of wine q couple weeks"   Drug use: No   Sexual activity: Yes  Other Topics Concern   Not on file  Social History Narrative   Moved to [place] from [place] in 2015.     Social  Determinants of Health   Financial Resource Strain: Not on file  Food Insecurity: Not on file  Transportation Needs: Not on file  Physical Activity: Not on file  Stress: Not on file  Social Connections: Not on file  Intimate Partner Violence: Not on file    FAMILY HISTORY: Family History  Problem Relation Age of Onset   Clotting disorder Mother        lung cancer   Stroke Mother    Stroke Father    Liver cancer  Maternal Uncle    Kidney cancer Neg Hx    Alcoholism Neg Hx     ALLERGIES:  has No Known Allergies.  MEDICATIONS:  Current Outpatient Medications  Medication Sig Dispense Refill   ferrous sulfate 325 (65 FE) MG tablet Take 1 tablet (325 mg total) by mouth daily. 30 tablet 1   polyethylene glycol (MIRALAX) 17 g packet Take 17 g by mouth daily. 14 each 0   No current facility-administered medications for this visit.    PHYSICAL EXAMINATION: ECOG PERFORMANCE STATUS: 1 - Symptomatic but completely ambulatory  Vitals:   01/31/23 1540  BP: 132/84  Pulse: 90  Resp: 18  Temp: 98.5 F (36.9 C)  SpO2: 100%   Filed Weights   01/31/23 1540  Weight: 172 lb 6.4 oz (78.2 kg)     GENERAL:alert, no distress and comfortable SKIN: skin color normal, no rashes or significant lesions EYES: normal, Conjunctiva are pink and non-injected, sclera clear  NEURO: alert & oriented x 3 with fluent speech NECK: (-) supple, thyroid normal size, non-tender, without nodularity LYMPH: (-) no palpable lymphadenopathy in the cervical, axillary  LUNGS: (-) clear to auscultation and percussion with normal breathing effort HEART: (-) regular rate & rhythm and no murmurs and no lower extremity edema ABDOMEN:(-) abdomen soft, (-) non-tender and(-)  normal bowel sounds   LABORATORY DATA:  I have reviewed the data as listed    Latest Ref Rng & Units 01/31/2023    3:05 PM 12/15/2022    4:17 AM 12/17/2014    4:37 AM  CBC  WBC 4.0 - 10.5 K/uL 7.2  10.9  4.9   Hemoglobin 12.0 - 15.0 g/dL 7.3  7.6  96.2   Hematocrit 36.0 - 46.0 % 25.1  28.8  34.5   Platelets 150 - 400 K/uL 275  477  326        Latest Ref Rng & Units 01/31/2023    3:05 PM 12/15/2022    4:17 AM 12/17/2014    4:37 AM  CMP  Glucose 70 - 99 mg/dL 94  952  841   BUN 6 - 20 mg/dL 8  12  <5   Creatinine 0.44 - 1.00 mg/dL 3.24  4.01  0.27   Sodium 135 - 145 mmol/L 138  137  138   Potassium 3.5 - 5.1 mmol/L 3.7  3.4  3.8   Chloride 98 - 111  mmol/L 106  104  108   CO2 22 - 32 mmol/L 26  25  26    Calcium 8.9 - 10.3 mg/dL 8.6  8.3  8.4   Total Protein 6.5 - 8.1 g/dL 6.8  6.7  6.8   Total Bilirubin 0.3 - 1.2 mg/dL 0.3  0.5  0.9   Alkaline Phos 38 - 126 U/L 47  48  147   AST 15 - 41 U/L 34  18  377   ALT 0 - 44 U/L 53  15  685  RADIOGRAPHIC STUDIES: I have personally reviewed the radiological images as listed and agreed with the findings in the report. No results found.  ASSESSMENT & PLAN:  Sylvia Salinas is a 42 y.o.  female with a history of    Microcytic anemia, likely iron deficient anemia -Noticed that during her ED visit for sciatic pain. -CBC showed microcytic moderate anemia, with slightly elevated platelet count.  She has been having heavy menstrual period, this is likely iron deficient anemia from her menorrhagia -I will repeat her CBC, and check iron study, reticulocyte count and B12 -She has tried oral iron, does not tolerate it very well.  If her anemia does not improve, I recommend IV iron -Monitor CBC in the next few months.  If her anemia does not resolve after adequate IV iron, will obtain additional anemia workup.    PLAN:  -Recommend IV Iron -I will schedule for IV Venofer  400 MG weekly  x3 infusion  -lab and f/u in 2 months  Orders Placed This Encounter  Procedures   CBC with Differential/Platelet    Standing Status:   Standing    Number of Occurrences:   50    Standing Expiration Date:   01/31/2024   Ferritin    Standing Status:   Standing    Number of Occurrences:   20    Standing Expiration Date:   01/31/2024   Vitamin B12    Standing Status:   Future    Number of Occurrences:   1    Standing Expiration Date:   01/31/2024   Folate RBC    Standing Status:   Future    Number of Occurrences:   1    Standing Expiration Date:   01/31/2024   Retic Panel    Standing Status:   Future    Number of Occurrences:   1    Standing Expiration Date:   01/31/2024    All questions were  answered. The patient knows to call the clinic with any problems, questions or concerns. The total time spent in the appointment was 30 minutes.     Malachy Mood, MD 01/31/2023  Carolin Coy, am acting as scribe for Malachy Mood, MD.   I have reviewed the above documentation for accuracy and completeness, and I agree with the above.

## 2023-02-03 LAB — FOLATE RBC
Folate, Hemolysate: 318 ng/mL
Folate, RBC: 1331 ng/mL (ref >498)
Hematocrit: 23.9 % — ABNORMAL LOW (ref 34.0–46.6)

## 2023-02-07 ENCOUNTER — Ambulatory Visit (INDEPENDENT_AMBULATORY_CARE_PROVIDER_SITE_OTHER): Payer: 59

## 2023-02-07 ENCOUNTER — Encounter: Payer: Self-pay | Admitting: Physical Medicine & Rehabilitation

## 2023-02-07 ENCOUNTER — Encounter: Payer: 59 | Attending: Physical Medicine & Rehabilitation | Admitting: Physical Medicine & Rehabilitation

## 2023-02-07 VITALS — BP 138/84 | HR 93 | Ht 65.0 in | Wt 171.0 lb

## 2023-02-07 DIAGNOSIS — M5126 Other intervertebral disc displacement, lumbar region: Secondary | ICD-10-CM | POA: Insufficient documentation

## 2023-02-07 MED ORDER — PREGABALIN 100 MG PO CAPS: 100.0000 mg | ORAL_CAPSULE | Freq: Every day | ORAL | 1 refills | Status: DC

## 2023-02-07 NOTE — Patient Instructions (Signed)
Please call Dr Lorrine Kin if Sciatic pain flares up again

## 2023-02-07 NOTE — Progress Notes (Signed)
Subjective:    Patient ID: Sylvia Salinas, female    DOB: [birth year], 42 y.o.   MRN: [id number]  HPI 42 year old female with remote history of gallstone pancreatitis in 2016 but otherwise healthy who was referred for the evaluation of sciatic pain left lower extremity.  The patient has had this problem for more than 2 months. Patient works part-time as a Dietitian and felt the issue started with knee pain   Still has pain going down left post thigh  Seen by [facility]  Left sciatica better since epidural injection Goes to [employer], DC and therapy 2x per week (1/2 way into 40 sessions ) upon further review, the patient states that the treatments at the chiropractic clinic are mainly traction and manipulation and not active exercise at this point.  Off meloxicam since injection , but was not effective  Off methocarbamol not very helpful  Off gabapentin not helpful   ESI Dr Lorrine Kin very helpful    MRI LUMBAR SPINE WITHOUT CONTRAST   TECHNIQUE: Multiplanar, multisequence MR imaging of the lumbar spine was performed. No intravenous contrast was administered.   COMPARISON:  None Available.   FINDINGS: Segmentation:  Standard.   Alignment:  Slight spinal curvature   Vertebrae: No fracture, evidence of discitis, or bone lesion. Cystic intensity partially covered at the right ilium, near the sacroiliac joint and suspect a subchondral cyst/ganglion.   Conus medullaris and cauda equina: Conus extends to the L1-2 level. Conus and cauda equina appear normal.   Paraspinal and other soft tissues: Negative for perispinal mass or inflammation.   Disc levels:   Focal L5-S1 disc desiccation and narrowing with left paracentral herniation impinging on the left S1 nerve root. Mild facet spurring at L4-5.   IMPRESSION: L5-S1 left paracentral extrusion impinging on the left S1 nerve root.     Electronically Signed   By: Tiburcio Pea M.D.   On: 12/15/2022  09:35 Pain Inventory Average Pain 6 Pain Right Now 8 My pain is constant, burning, dull, tingling, and aching  In the last 24 hours, has pain interfered with the following? General activity 7 Relation with others 8 Enjoyment of life 6 What TIME of day is your pain at its worst? morning , evening, and night Sleep (in general) Poor  Pain is worse with: walking, bending, standing, and some activites Pain improves with: rest, heat/ice, and therapy/exercise Relief from Meds: 3  walk without assistance how many minutes can you walk? 5 minutes ability to climb steps?  yes do you drive?  yes Do you have any goals in this area?  yes  employed # of hrs/week Research scientist (physical sciences) I need assistance with the following:  household duties  No problems in this area  CT/MRI [facility]  Any changes since last visit?  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   Tobacco Use   Smoking status: Never   Smokeless tobacco: Never  Vaping Use   Vaping Use: Never used  Substance and Sexual Activity   Alcohol use: Not Currently    Comment: 11/30/2014 "glass of wine q couple weeks"   Drug use: No   Sexual activity: Yes  Other Topics Concern   Not on file  Social History Narrative   Moved to [place] from [place] in 2015.     Social Determinants of Health   Financial Resource Strain: Not on file  Food Insecurity: Not on file  Transportation Needs: Not on file  Physical Activity: Not on  file  Stress: Not on file  Social Connections: Not on file   Past Surgical History:  Procedure Laterality Date   ABDOMINAL HERNIA REPAIR  2005   CESAREAN SECTION  04/2003; 01/2009   CHOLECYSTECTOMY N/A 12/02/2014   Procedure: LAPAROSCOPIC CHOLECYSTECTOMY WITH ATTEMPTED INTRAOPERATIVE CHOLANGIOGRAM;  Surgeon: Abigail Miyamoto, MD;  Location: [facility] OR;  Service: General;  Laterality: N/A;   ERCP N/A 12/16/2014   Procedure: ENDOSCOPIC RETROGRADE CHOLANGIOPANCREATOGRAPHY (ERCP);  Surgeon: Iva Boop, MD;  Location: [facility] ENDOSCOPY;  Service: Endoscopy;  Laterality: N/A;   HERNIA REPAIR  2005   Past Medical History:  Diagnosis Date   Acute pancreatitis 11/30/2014   BP 138/84   Pulse 93   Ht 5\' 5"  (1.651 m)   Wt 171 lb (77.6 kg)   BMI 28.46 kg/m   Opioid Risk Score:   Fall Risk Score:  `1  Depression screen [facility] 2/9     02/07/2023    9:48 AM 01/03/2023    9:06 AM  Depression screen PHQ 2/9  Decreased Interest 0 0  Down, Depressed, Hopeless 0 0  PHQ - 2 Score 0 0  Altered sleeping 2   Tired, decreased energy 1   Change in appetite 1   Feeling bad or failure about yourself  0   Trouble concentrating 0   Moving slowly or fidgety/restless 0   Suicidal thoughts 0   PHQ-9 Score 4     Review of Systems     Objective:   Physical Exam  Negative straight leg raise on the right side positive straight leg raise on the left side Decreased sensation left little toe Calf weakness left lower extremity versus right side no atrophy noted at the calf.  She is able to do 10 calf raises on the left very slowly and partial elevation full elevation greater than 10 on the right side Deep tendon reflexes 1+ bilateral knees 2+ right ankle 0 left ankle Lumbar spine no tenderness palpation no deformity noted.  No pain with hip range of motion Ambulates without assistive device no evidence of toe drag or knee instability      Assessment & Plan:  #1.  Her herniated nucleus pulposus L5-S1 impinging  upon the left S1 nerve root with signs of radiculopathy.  We discussed that with time this can resorb but may take up to a year.  Thus far has not had much relief from medications her pain is mainly neuropathic therefore we will trial pregabalin 100 mg nightly to see if least she can have improved sleep.  She does not wish to have any drowsiness during the day while she is working. We discussed the potential need for repeat epidural steroid injection should her pain increase again. We discussed that she is not really doing any type of active therapy now  that her pain is somewhat improved I think it would be reasonable to try some core strengthening with physical therapy. I would like to see her in 2 months.  Discussed with patient agrees plan.

## 2023-02-11 ENCOUNTER — Telehealth (INDEPENDENT_AMBULATORY_CARE_PROVIDER_SITE_OTHER): Payer: Self-pay | Admitting: Primary Care

## 2023-02-11 NOTE — Telephone Encounter (Signed)
Spoke with pt and booked an apt

## 2023-02-20 ENCOUNTER — Telehealth: Payer: 59 | Admitting: Physician Assistant

## 2023-02-20 DIAGNOSIS — B3731 Acute candidiasis of vulva and vagina: Secondary | ICD-10-CM | POA: Diagnosis not present

## 2023-02-20 MED ORDER — FLUCONAZOLE 150 MG PO TABS: 150.0000 mg | ORAL_TABLET | Freq: Once | ORAL | 0 refills | Status: AC

## 2023-02-20 NOTE — Progress Notes (Signed)

## 2023-02-20 NOTE — Progress Notes (Signed)
I have spent 5 minutes in review of e-visit questionnaire, review and updating patient chart, medical decision making and response to patient.   Annabell Oconnor Cody Laiken Nohr, PA-C    

## 2023-02-21 ENCOUNTER — Encounter: Payer: Self-pay | Admitting: Hematology

## 2023-02-21 DIAGNOSIS — D500 Iron deficiency anemia secondary to blood loss (chronic): Secondary | ICD-10-CM | POA: Insufficient documentation

## 2023-02-21 NOTE — Addendum Note (Signed)
Addended by: Malachy Mood on: 02/21/2023 09:29 AM   Modules accepted: Orders

## 2023-02-24 ENCOUNTER — Ambulatory Visit (INDEPENDENT_AMBULATORY_CARE_PROVIDER_SITE_OTHER): Payer: 59

## 2023-02-24 ENCOUNTER — Encounter (INDEPENDENT_AMBULATORY_CARE_PROVIDER_SITE_OTHER): Payer: Self-pay

## 2023-02-25 ENCOUNTER — Ambulatory Visit (INDEPENDENT_AMBULATORY_CARE_PROVIDER_SITE_OTHER): Payer: 59 | Admitting: Primary Care

## 2023-02-25 ENCOUNTER — Telehealth: Payer: Self-pay | Admitting: Hematology

## 2023-02-25 NOTE — Telephone Encounter (Signed)
Patient requested appointment to be cancelled. Unable to leave a message about next following appointments due to voicemail box being full. Had reached out twice

## 2023-02-26 ENCOUNTER — Inpatient Hospital Stay: Payer: 59

## 2023-03-05 ENCOUNTER — Inpatient Hospital Stay: Payer: 59 | Attending: Hematology and Oncology

## 2023-03-11 ENCOUNTER — Other Ambulatory Visit: Payer: Self-pay

## 2023-03-11 ENCOUNTER — Ambulatory Visit (INDEPENDENT_AMBULATORY_CARE_PROVIDER_SITE_OTHER): Payer: 59 | Admitting: Physical Therapy

## 2023-03-11 ENCOUNTER — Encounter: Payer: Self-pay | Admitting: Physical Therapy

## 2023-03-11 DIAGNOSIS — R293 Abnormal posture: Secondary | ICD-10-CM

## 2023-03-11 DIAGNOSIS — M5459 Other low back pain: Secondary | ICD-10-CM | POA: Diagnosis not present

## 2023-03-11 DIAGNOSIS — M6281 Muscle weakness (generalized): Secondary | ICD-10-CM

## 2023-03-11 DIAGNOSIS — R262 Difficulty in walking, not elsewhere classified: Secondary | ICD-10-CM

## 2023-03-11 NOTE — Therapy (Signed)
OUTPATIENT PHYSICAL THERAPY THORACOLUMBAR EVALUATION   Patient Name: Sylvia Salinas MRN: [id number] DOB:May 18, 1981, 42 y.o., female Today's Date: 03/11/2023  END OF SESSION:  PT End of Session - 03/11/23 1338     Visit Number 1    Number of Visits 20    Date for PT Re-Evaluation 05/23/23    PT Start Time 1300    PT Stop Time 1340    PT Time Calculation (min) 40 min    Activity Tolerance Patient tolerated treatment well    Behavior During Therapy [facility] for tasks assessed/performed             Past Medical History:  Diagnosis Date   Acute pancreatitis 11/30/2014   Past Surgical History:  Procedure Laterality Date   ABDOMINAL HERNIA REPAIR  2005   CESAREAN SECTION  04/2003; 01/2009   CHOLECYSTECTOMY N/A 12/02/2014   Procedure: LAPAROSCOPIC CHOLECYSTECTOMY WITH ATTEMPTED INTRAOPERATIVE CHOLANGIOGRAM;  Surgeon: Abigail Miyamoto, MD;  Location: [facility] OR;  Service: General;  Laterality: N/A;   ERCP N/A 12/16/2014   Procedure: ENDOSCOPIC RETROGRADE CHOLANGIOPANCREATOGRAPHY (ERCP);  Surgeon: Iva Boop, MD;  Location: [facility] ENDOSCOPY;  Service: Endoscopy;  Laterality: N/A;   HERNIA REPAIR  2005   Patient Active Problem List   Diagnosis Date Noted   Iron deficiency anemia due to chronic blood loss 02/21/2023   Microcytic anemia 01/06/2023   Choledocholithiasis with obstruction 12/16/2014   Calculus of bile duct with obstruction and without cholangitis or cholecystitis    Elevated liver enzymes 12/15/2014   Gallstones    Acute pancreatitis    Common bile duct dilation    Nausea with vomiting    Pancreatitis 11/30/2014    PCP: No PCP listed   REFERRING PROVIDER:  Erick Colace, MD   REFERRING DIAG: M51.26 (ICD-10-CM) - HNP (herniated nucleus pulposus), lumbar  Rationale for Evaluation and Treatment: Rehabilitation  THERAPY DIAG:  Other low back pain  ONSET DATE: since march this year  SUBJECTIVE:                                                                                                                                                                                            SUBJECTIVE STATEMENT: Pt arriving with dx of  herniated nucleus pulposus L5-S1 impinging upon the left S1 nerve root with signs of radiculopathy.  Pt reporting no relief with over the counter pain medications. Pt reporting the pain has been on-going for a while. Pt reporting less pain when sitting, pain worsens with walking.   PERTINENT HISTORY:  Acute pancreatitis, abdominal hernia repair 2005, c-section 2004, cholecystectomy, ERCP  PAIN:  NPRS scale: 6/10 Pain location:  left side low back, sciatic pain Pain description: throbbing, shooting down left LE to back of knee Aggravating factors: walking, standing  Relieving factors: sitting  PRECAUTIONS: None  WEIGHT BEARING RESTRICTIONS: No  FALLS:  Has patient fallen in last 6 months? 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    PATIENT SURVEYS:  03/11/23 FOTO eval:   41%     SCREENING FOR RED FLAGS: Bowel or bladder incontinence: No Cauda equina syndrome: No  COGNITION: Overall cognitive status: WFL normal      SENSATION: WFL  MUSCLE LENGTH: Tightness noted in bilateral iliopsoas  POSTURE:  rounded shoulders, forward head, increased lumbar lordosis, and increased thoracic kyphosis  PALPATION: TTP: bilateral  lumbar multifidi  LUMBAR ROM:   AROM 03/11/23  Flexion 80  Extension 18  Right lateral flexion 40  Left  lateral flexion 38  Right rotation WFL  Left rotation WFL   (Blank rows = not tested)  LOWER EXTREMITY ROM:       Right 03/11/23 Left 03/11/23  Hip flexion 120 120  Hip extension    Hip abduction    Hip adduction    Hip internal rotation    Hip external rotation    Knee flexion    Knee extension     (Blank rows = not tested)  LOWER EXTREMITY MMT:    MMT Right 03/11/23 Left 03/11/23  Hip flexion 5 4  Hip extension    Hip abduction 5 4  Hip adduction 5 4  Hip internal rotation    Hip external rotation    Knee flexion 5 5  Knee extension 5 4  Ankle dorsiflexion    Ankle plantarflexion    Ankle inversion    Ankle eversion     (Blank rows = not tested)  LUMBAR SPECIAL TESTS:  03/11/23:  Slump test: Positive left   GAIT: Distance walked: 30 feet level surface Assistive device utilized: None Level of assistance: Complete Independence Comments: [facility]  TODAY'S TREATMENT:                                                                                                         DATE:  03/11/23:  Therex:    HEP instruction/performance c cues for techniques, handout provided.  Trial set performed of each for comprehension and symptom assessment.  See below for exercise list  PATIENT EDUCATION:  Education details: HEP, POC Person educated: Patient Education method: Explanation, Demonstration, Verbal cues, and Handouts Education comprehension: verbalized understanding, returned demonstration, and verbal cues required  HOME EXERCISE PROGRAM: Access Code: [email or web address] URL: [email or web address] Date: 03/11/2023 Prepared by: Narda Amber  Exercises - Supine Posterior Pelvic Tilt  - 2 x daily - 7 x weekly - 10 reps - 5 seconds hold - Supine Lower Trunk  Rotation  - 2 x daily - 7 x weekly - 3 reps - 30 seconds hold - Supine Piriformis Stretch with Foot on Ground  - 2 x daily - 7 x weekly - 3 reps - 30 seconds hold - Supine Figure 4 Piriformis Stretch  - 2 x daily - 7 x weekly - 3 reps - 30 seconds hold - Supine Single Knee to Chest Stretch  - 2 x daily - 7 x weekly - 3 reps - 30 seconds hold - Hooklying Hamstring Stretch with Strap  - 2 x daily - 7 x weekly - 3 reps - 30 seconds hold - Standing Lumbar Extension at Wall - Forearms  - 2 x daily - 7 x weekly - 10 reps - 5 seconds hold  ASSESSMENT:  CLINICAL IMPRESSION: Patient is a 42 y.o. who comes to clinic with complaints of low back pain c left side sciatic symptoms. Pt presents with mobility, strength and movement coordination deficits that impair their ability to perform usual daily and recreational functional activities without increase difficulty/symptoms at this time.  Patient to benefit from skilled PT services to address impairments and limitations to improve to previous level of function without restriction secondary to condition.   OBJECTIVE IMPAIRMENTS: decreased mobility, difficulty walking, decreased ROM, decreased strength, impaired flexibility, and pain.   ACTIVITY LIMITATIONS: sitting, standing, squatting, sleeping, and stairs  PARTICIPATION LIMITATIONS: cleaning, laundry, community activity, and occupation  PERSONAL FACTORS: 3+ comorbidities: see pertinent history above  are also affecting patient's functional outcome.   REHAB POTENTIAL: Good  CLINICAL DECISION MAKING: Stable/uncomplicated  EVALUATION COMPLEXITY: Low   GOALS: Goals reviewed with patient? Yes  SHORT TERM GOALS: (target date for Short term goals are 3 weeks 04/04/23)  1. Patient will demonstrate independent use of home exercise program to maintain progress from in clinic treatments.  Goal status: New  LONG TERM GOALS: (target dates for all long term goals are 10 weeks  05/23/23 )   1. Patient will  demonstrate/report pain at worst less than or equal to 2/10 to facilitate minimal limitation in daily activity secondary to pain symptoms.  Goal status: New   2. Patient will demonstrate independent use of home exercise program to facilitate ability to maintain/progress functional gains from skilled physical therapy  services.  Goal status: New   3. Patient will demonstrate FOTO outcome > or = 66 % to indicate reduced disability due to condition.  Goal status: New   4. Patient will demonstrate lumbar extension 100 % WFL s symptoms to facilitate upright standing, walking posture at PLOF s limitation.  Goal status: New   5.  Pt will be able to transfer her son from his w/c with back of pain of </= 2/10.   Goal status: New   6.  Pt will be able to lift 30# from floor to counter height using correct body mechanics with no pain.  Goal status: New     PLAN:  PT FREQUENCY: 1-2x/week  PT DURATION: 10 weeks  PLANNED INTERVENTIONS: Therapeutic exercises, Therapeutic activity, Neuro Muscular re-education, Balance training, Gait training, Patient/Family education, Joint mobilization, Stair training, DME instructions, Dry Needling, Electrical stimulation, Cryotherapy, vasopneumatic device, Moist heat, Taping, Traction Ultrasound, Ionotophoresis 4mg /ml Dexamethasone, and aquatic therapy, Manual therapy.  All included unless contraindicated  PLAN FOR NEXT SESSION: Review HEP knowledge/results, core strengthening, mobs, manual as needed, consider DN next visit, add hip flexor stretch    Sharmon Leyden, PT, MPT 03/11/2023, 1:55 PM

## 2023-03-12 ENCOUNTER — Inpatient Hospital Stay: Payer: 59

## 2023-03-21 ENCOUNTER — Encounter: Payer: 59 | Attending: Physical Medicine & Rehabilitation | Admitting: Physical Medicine & Rehabilitation

## 2023-03-21 DIAGNOSIS — M5126 Other intervertebral disc displacement, lumbar region: Secondary | ICD-10-CM | POA: Insufficient documentation

## 2023-03-25 ENCOUNTER — Encounter: Payer: Self-pay | Admitting: Physical Therapy

## 2023-03-25 ENCOUNTER — Ambulatory Visit (INDEPENDENT_AMBULATORY_CARE_PROVIDER_SITE_OTHER): Payer: 59 | Admitting: Physical Therapy

## 2023-03-25 DIAGNOSIS — M6281 Muscle weakness (generalized): Secondary | ICD-10-CM

## 2023-03-25 DIAGNOSIS — R262 Difficulty in walking, not elsewhere classified: Secondary | ICD-10-CM

## 2023-03-25 DIAGNOSIS — M5459 Other low back pain: Secondary | ICD-10-CM

## 2023-03-25 DIAGNOSIS — R293 Abnormal posture: Secondary | ICD-10-CM | POA: Diagnosis not present

## 2023-03-25 NOTE — Therapy (Addendum)
OUTPATIENT PHYSICAL THERAPY TREATMENT DISCHARGE SUMMARY   Patient Name: Sylvia Salinas MRN: [id number] DOB:[birth year], 42 y.o., female Today's Date: 03/25/2023  END OF SESSION:  PT End of Session - 03/25/23 0930     Visit Number 2    Number of Visits 20    Date for PT Re-Evaluation 05/23/23    PT Start Time 0930    PT Stop Time 1010    PT Time Calculation (min) 40 min    Activity Tolerance Patient tolerated treatment well    Behavior During Therapy [facility] for tasks assessed/performed              Past Medical History:  Diagnosis Date   Acute pancreatitis 11/30/2014   Past Surgical History:  Procedure Laterality Date   ABDOMINAL HERNIA REPAIR  2005   CESAREAN SECTION  04/2003; 01/2009   CHOLECYSTECTOMY N/A 12/02/2014   Procedure: LAPAROSCOPIC CHOLECYSTECTOMY WITH ATTEMPTED INTRAOPERATIVE CHOLANGIOGRAM;  Surgeon: Abigail Miyamoto, MD;  Location: [facility] OR;  Service: General;  Laterality: N/A;   ERCP N/A 12/16/2014   Procedure: ENDOSCOPIC RETROGRADE CHOLANGIOPANCREATOGRAPHY (ERCP);  Surgeon: Iva Boop, MD;  Location: [facility] ENDOSCOPY;  Service: Endoscopy;  Laterality: N/A;   HERNIA REPAIR  2005   Patient Active Problem List   Diagnosis Date Noted   Iron deficiency anemia due to chronic blood loss 02/21/2023   Microcytic anemia 01/06/2023   Choledocholithiasis with obstruction 12/16/2014   Calculus of bile duct with obstruction and without cholangitis or cholecystitis    Elevated liver enzymes 12/15/2014   Gallstones    Acute pancreatitis    Common bile duct dilation    Nausea with vomiting    Pancreatitis 11/30/2014    PCP: No PCP listed   REFERRING PROVIDER:  Erick Colace, MD   REFERRING DIAG: M51.26 (ICD-10-CM) - HNP (herniated nucleus pulposus), lumbar  Rationale for Evaluation and Treatment: Rehabilitation  THERAPY DIAG:  Other low back pain  Muscle weakness (generalized)  Difficulty in walking, not elsewhere classified  Abnormal posture  ONSET  DATE: since march this year  SUBJECTIVE:                                                                                                                                                                                           SUBJECTIVE STATEMENT: Feels about 50% better since last visit.  PERTINENT HISTORY:  Acute pancreatitis, abdominal hernia repair 2005, c-section 2004, cholecystectomy, ERCP  PAIN:  NPRS scale: 6 currently/10 Pain location: left side low back, sciatic pain Pain description: throbbing, shooting down left LE to back of knee Aggravating factors: walking, standing  Relieving factors: sitting  PRECAUTIONS: None  WEIGHT BEARING RESTRICTIONS: No  FALLS:  Has patient fallen in last 6 months? 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    PATIENT SURVEYS:  03/11/23 FOTO eval:   41%    SCREENING FOR RED FLAGS: Bowel or bladder incontinence: No Cauda equina syndrome: No  COGNITION: Overall cognitive status: WFL normal      SENSATION: WFL  MUSCLE LENGTH: Tightness noted in bilateral iliopsoas  POSTURE:  rounded shoulders, forward head, increased lumbar lordosis, and increased thoracic kyphosis  PALPATION: TTP: bilateral  lumbar multifidi  LUMBAR ROM:   AROM 03/11/23  Flexion 80  Extension 18  Right lateral flexion 40  Left lateral flexion 38  Right rotation WFL  Left rotation WFL   (Blank rows = not tested)  LOWER EXTREMITY ROM:       Right 03/11/23 Left 03/11/23   Hip flexion 120 120   (Blank rows = not tested)  LOWER EXTREMITY MMT:    MMT Right 03/11/23 Left 03/11/23  Hip flexion 5 4  Hip extension    Hip abduction 5 4  Hip adduction 5 4  Hip internal rotation    Hip external rotation    Knee flexion 5 5  Knee extension 5 4  Ankle dorsiflexion    Ankle plantarflexion    Ankle inversion    Ankle eversion     (Blank rows = not tested)  LUMBAR SPECIAL TESTS:  03/11/23:  Slump test: Positive left   GAIT: Distance walked: 30 feet level surface Assistive device utilized: None Level of assistance: Complete Independence Comments: WFL                                                                                                                                                                                                                    TODAY'S TREATMENT:  DATE:  03/25/23 TherEx NuStep L5 x 8 min Posterior pelvic tilt 10 x 5 sec hold Lower trunk rotation 3x30 sec bil Piriformis stretch 3x30 sec bil Supine hamstring stretch 3x30 sec Supine bridge 2x10; 5 sec hold Standing lumbar extension 10 x 3 sec hold   03/11/23:  Therex:    HEP instruction/performance c cues for techniques, handout provided.  Trial set performed of each for comprehension and symptom assessment.  See below for exercise list  PATIENT EDUCATION:  Education details: HEP, POC Person educated: Patient Education method: Explanation, Demonstration, Verbal cues, and Handouts Education comprehension: verbalized understanding, returned demonstration, and verbal cues required  HOME EXERCISE PROGRAM: Access Code: [email or web address] URL: [email or web address] Date: 03/11/2023 Prepared by: Narda Amber  Exercises - Supine Posterior Pelvic Tilt  - 2 x daily - 7 x weekly - 10 reps - 5 seconds hold - Supine Lower Trunk Rotation  - 2 x daily - 7 x weekly - 3  reps - 30 seconds hold - Supine Piriformis Stretch with Foot on Ground  - 2 x daily - 7 x weekly - 3 reps - 30 seconds hold - Supine Figure 4 Piriformis Stretch  - 2 x daily - 7 x weekly - 3 reps - 30 seconds hold - Supine Single Knee to Chest Stretch  - 2 x daily - 7 x weekly - 3 reps - 30 seconds hold - Hooklying Hamstring Stretch with Strap  - 2 x daily - 7 x weekly - 3 reps - 30 seconds hold - Standing Lumbar Extension at Wall - Forearms  - 2 x daily - 7 x weekly - 10 reps - 5 seconds hold  ASSESSMENT:  CLINICAL IMPRESSION: Pt tolerated session well today needing min cues for HEP review.  Overall reporting 50% improvement in symptoms so deferred DN for now but can revisit in future.  Will continue to benefit from PT to maximize function.  OBJECTIVE IMPAIRMENTS: decreased mobility, difficulty walking, decreased ROM, decreased strength, impaired flexibility, and pain.   ACTIVITY LIMITATIONS: sitting, standing, squatting, sleeping, and stairs  PARTICIPATION LIMITATIONS: cleaning, laundry, community activity, and occupation  PERSONAL FACTORS: 3+ comorbidities: see pertinent history above  are also affecting patient's functional outcome.   REHAB POTENTIAL: Good  CLINICAL DECISION MAKING: Stable/uncomplicated  EVALUATION COMPLEXITY: Low   GOALS: Goals reviewed with patient? Yes  SHORT TERM GOALS: (target date for Short term goals are 3 weeks 04/04/23)  1. Patient will demonstrate independent use of home exercise program to maintain progress from in clinic treatments.  Goal status: New  LONG TERM GOALS: (target dates for all long term goals are 10 weeks  05/23/23 )   1. Patient will demonstrate/report pain at worst less than or equal to 2/10 to facilitate minimal limitation in daily activity secondary to pain symptoms. Goal status: New   2. Patient will demonstrate independent use of home exercise program to facilitate ability to maintain/progress functional gains from skilled  physical therapy services. Goal status: New   3. Patient will demonstrate FOTO outcome > or = 66 % to indicate reduced disability due to condition. Goal status: New   4. Patient will demonstrate lumbar extension 100 % WFL s symptoms to facilitate upright standing, walking posture at PLOF s limitation. Goal status: New   5.  Pt will be able to transfer her son from his w/c with back of pain of </= 2/10.   Goal status: New   6.  Pt will be able to lift 30# from floor to counter  height using correct body mechanics with no pain.  Goal status: New     PLAN:  PT FREQUENCY: 1-2x/week  PT DURATION: 10 weeks  PLANNED INTERVENTIONS: Therapeutic exercises, Therapeutic activity, Neuro Muscular re-education, Balance training, Gait training, Patient/Family education, Joint mobilization, Stair training, DME instructions, Dry Needling, Electrical stimulation, Cryotherapy, vasopneumatic device, Moist heat, Taping, Traction Ultrasound, Ionotophoresis 4mg /ml Dexamethasone, and aquatic therapy, Manual therapy.  All included unless contraindicated  PLAN FOR NEXT SESSION: core strengthening, mobs, manual as needed, consider DN next visit, add hip flexor stretch    Clarita Crane, PT, DPT 03/25/23 10:22 AM    PHYSICAL THERAPY DISCHARGE SUMMARY  Visits from Start of Care: 2  Current functional level related to goals / functional outcomes: See above   Remaining deficits: unknown   Education / Equipment: HEP   Patient agrees to discharge. Patient goals were not met. Patient is being discharged due to not returning since the last visit.  Clarita Crane, PT, DPT 04/02/23 2:08 PM  [facility] [address] [place], [place], [address] Phone: [phone]   Fax:  [phone]

## 2023-03-27 ENCOUNTER — Encounter: Payer: 59 | Admitting: Physical Therapy

## 2023-03-27 ENCOUNTER — Telehealth: Payer: Self-pay | Admitting: Physical Therapy

## 2023-03-27 NOTE — Telephone Encounter (Signed)
Attempted to call pt due to no show.  VM full, unable to leave a message.  Clarita Crane, PT, DPT 03/27/23 2:02 PM

## 2023-03-31 ENCOUNTER — Encounter: Payer: 59 | Admitting: Physical Therapy

## 2023-03-31 ENCOUNTER — Telehealth: Payer: Self-pay | Admitting: Physical Therapy

## 2023-03-31 NOTE — Telephone Encounter (Signed)
Called pt as she did not show for her PT appt. Pt thought her appt was tomorrow, 04/01/23.  Able to offer appointment for 04/01/23.

## 2023-04-01 ENCOUNTER — Encounter: Payer: 59 | Admitting: Physical Therapy

## 2023-04-01 ENCOUNTER — Telehealth: Payer: Self-pay | Admitting: Physical Therapy

## 2023-04-01 NOTE — Telephone Encounter (Signed)
I called pt after she did not show for her PT appointment today at 1:45. I left a message regarding pt's next appointment tomorrow at 1:45. I also left a message informing pt if she fails to show for her appointment tomorrow we will have to discharge her from PT services due to >3 missed visits.   Narda Amber, PT, MPT 04/01/23 2:05 PM

## 2023-04-02 ENCOUNTER — Inpatient Hospital Stay: Payer: 59

## 2023-04-02 ENCOUNTER — Encounter: Payer: 59 | Admitting: Physical Therapy

## 2023-04-02 ENCOUNTER — Inpatient Hospital Stay: Payer: 59 | Attending: Hematology and Oncology | Admitting: Hematology

## 2023-04-02 NOTE — Progress Notes (Deleted)
[facility]   [phone] [phone]   Clinic Follow up Note   Patient Care Team: Patient, No Pcp Per as PCP - General (General Practice)  Date of Service:  04/02/2023  CHIEF COMPLAINT: f/u of anemia   CURRENT THERAPY:  Iron Sucrose (Venofer) 400 mg  ASSESSMENT: *** Sylvia Salinas is a 42 y.o. female with   No problem-specific Assessment & Plan notes found for this encounter.  ***   PLAN:     SUMMARY OF ONCOLOGIC HISTORY: Oncology History   No history exists.     INTERVAL HISTORY: *** Sylvia Salinas is here for a follow up of anemia . She was last seen by me on 02/01/2023. She presents to the clinic .        All other systems were reviewed with the patient and are negative.  MEDICAL HISTORY:  Past Medical History:  Diagnosis Date   Acute pancreatitis 11/30/2014    SURGICAL HISTORY: Past Surgical History:  Procedure Laterality Date   ABDOMINAL HERNIA REPAIR  2005   CESAREAN SECTION  04/2003; 01/2009   CHOLECYSTECTOMY N/A 12/02/2014   Procedure: LAPAROSCOPIC CHOLECYSTECTOMY WITH ATTEMPTED INTRAOPERATIVE CHOLANGIOGRAM;  Surgeon: Abigail Miyamoto, MD;  Location: [facility] OR;  Service: General;  Laterality: N/A;   ERCP N/A 12/16/2014   Procedure: ENDOSCOPIC RETROGRADE CHOLANGIOPANCREATOGRAPHY (ERCP);  Surgeon: Iva Boop, MD;  Location: [facility] ENDOSCOPY;  Service: Endoscopy;  Laterality: N/A;   HERNIA REPAIR  2005    I have reviewed the social history and family history with the patient and they are unchanged from previous note.  ALLERGIES:  has No Known Allergies.  MEDICATIONS:  Current Outpatient Medications  Medication Sig Dispense Refill   Ferrous Sulfate 27 MG TABS      ferrous sulfate 325 (65 FE) MG tablet Take 1 tablet (325 mg total) by mouth daily. (Patient not taking: Reported on 02/07/2023) 30 tablet 1   Ibuprofen-diphenhydrAMINE Cit 200-38 MG TABS  (Patient not taking: Reported on 02/07/2023)     meloxicam  (MOBIC) 15 MG tablet  (Patient not taking: Reported on 02/07/2023)     methocarbamol (ROBAXIN) 750 MG tablet  (Patient not taking: Reported on 02/07/2023)     polyethylene glycol (MIRALAX) 17 g packet Take 17 g by mouth daily. (Patient not taking: Reported on 02/07/2023) 14 each 0   pregabalin (LYRICA) 100 MG capsule Take 1 capsule (100 mg total) by mouth at bedtime. 30 capsule 1   No current facility-administered medications for this visit.    PHYSICAL EXAMINATION: ECOG PERFORMANCE STATUS: {CHL ONC ECOG [phone]}  There were no vitals filed for this visit. Wt Readings from Last 3 Encounters:  02/07/23 171 lb (77.6 kg)  01/31/23 172 lb 6.4 oz (78.2 kg)  01/03/23 180 lb 3.2 oz (81.7 kg)    {Only keep what was examined. If exam not performed, can use .CEXAM } GENERAL:alert, no distress and comfortable SKIN: skin color, texture, turgor are normal, no rashes or significant lesions EYES: normal, Conjunctiva are pink and non-injected, sclera clear {OROPHARYNX:no exudate, no erythema and lips, buccal mucosa, and tongue normal}  NECK: supple, thyroid normal size, non-tender, without nodularity LYMPH:  no palpable lymphadenopathy in the cervical, axillary {or inguinal} LUNGS: clear to auscultation and percussion with normal breathing effort HEART: regular rate & rhythm and no murmurs and no lower extremity edema ABDOMEN:abdomen soft, non-tender and normal bowel sounds Musculoskeletal:no cyanosis of digits and no clubbing  NEURO: alert & oriented x 3 with fluent speech, no focal motor/sensory  deficits  LABORATORY DATA:  I have reviewed the data as listed    Latest Ref Rng & Units 01/31/2023    3:05 PM 12/15/2022    4:17 AM 12/17/2014    4:37 AM  CBC  WBC 4.0 - 10.5 K/uL 7.2  10.9  4.9   Hemoglobin 12.0 - 15.0 g/dL 7.3  7.6  78.2   Hematocrit 34.0 - 46.6 % 36.0 - 46.0 % 23.9    25.1  28.8  34.5   Platelets 150 - 400 K/uL 275  477  326         Latest Ref Rng & Units 01/31/2023     3:05 PM 12/15/2022    4:17 AM 12/17/2014    4:37 AM  CMP  Glucose 70 - 99 mg/dL 94  956  213   BUN 6 - 20 mg/dL 8  12  <5   Creatinine 0.44 - 1.00 mg/dL 0.86  5.78  4.69   Sodium 135 - 145 mmol/L 138  137  138   Potassium 3.5 - 5.1 mmol/L 3.7  3.4  3.8   Chloride 98 - 111 mmol/L 106  104  108   CO2 22 - 32 mmol/L 26  25  26    Calcium 8.9 - 10.3 mg/dL 8.6  8.3  8.4   Total Protein 6.5 - 8.1 g/dL 6.8  6.7  6.8   Total Bilirubin 0.3 - 1.2 mg/dL 0.3  0.5  0.9   Alkaline Phos 38 - 126 U/L 47  48  147   AST 15 - 41 U/L 34  18  377   ALT 0 - 44 U/L 53  15  685       RADIOGRAPHIC STUDIES: I have personally reviewed the radiological images as listed and agreed with the findings in the report. No results found.    No orders of the defined types were placed in this encounter.  All questions were answered. The patient knows to call the clinic with any problems, questions or concerns. No barriers to learning was detected. The total time spent in the appointment was {CHL ONC TIME VISIT - GEXBM:[id number]}.     Salome Holmes, CMA 04/02/2023   I, Monica Martinez, CMA, am acting as scribe for Malachy Mood, MD.   {Add scribe attestation statement}

## 2023-04-02 NOTE — Assessment & Plan Note (Deleted)
-  Noticed that during her ED visit for sciatic pain. -CBC showed microcytic moderate anemia, with slightly elevated platelet count.  She has been having heavy menstrual period, this is likely iron deficient anemia from her menorrhagia -She has tried oral iron, does not tolerate it very well.  If her anemia does not improve, I recommend IV iron -Monitor CBC in the next few months.  If her anemia does not resolve after adequate IV iron, will obtain additional anemia workup.

## 2023-04-04 ENCOUNTER — Encounter (INDEPENDENT_AMBULATORY_CARE_PROVIDER_SITE_OTHER): Payer: Self-pay

## 2023-04-04 ENCOUNTER — Ambulatory Visit (INDEPENDENT_AMBULATORY_CARE_PROVIDER_SITE_OTHER): Payer: Self-pay | Admitting: Primary Care

## 2023-04-07 ENCOUNTER — Encounter: Payer: 59 | Admitting: Physical Therapy

## 2023-04-09 ENCOUNTER — Encounter: Payer: 59 | Admitting: Physical Therapy

## 2023-08-14 ENCOUNTER — Encounter: Payer: Self-pay | Admitting: Hematology

## 2023-08-15 ENCOUNTER — Telehealth: Payer: 59 | Admitting: Physician Assistant

## 2023-08-15 DIAGNOSIS — J208 Acute bronchitis due to other specified organisms: Secondary | ICD-10-CM

## 2023-08-15 MED ORDER — PSEUDOEPH-BROMPHEN-DM 30-2-10 MG/5ML PO SYRP: 5.0000 mL | ORAL_SOLUTION | Freq: Four times a day (QID) | ORAL | 0 refills | Status: DC | PRN

## 2023-08-15 MED ORDER — ALBUTEROL SULFATE HFA 108 (90 BASE) MCG/ACT IN AERS: 1.0000 | INHALATION_SPRAY | Freq: Four times a day (QID) | RESPIRATORY_TRACT | 0 refills | Status: DC | PRN

## 2023-08-15 NOTE — Progress Notes (Signed)
E-Visit for Cough   We are sorry that you are not feeling well.  Here is how we plan to help!  Based on your presentation I believe you most likely have A cough due to a virus.  This is called viral bronchitis and is best treated by rest, plenty of fluids and control of the cough.  You may use Ibuprofen or Tylenol as directed to help your symptoms.     In addition you may use Bromfed DM cough syrup Take 5mL every 6 hours as needed for cough, which I have prescribed.  I have also prescribed an Albuterol inhaler Use 1-2 puffs every 6 hours as needed for shortness of breath, chest tightness, and/or wheezing.   From your responses in the eVisit questionnaire you describe inflammation in the upper respiratory tract which is causing a significant cough.  This is commonly called Bronchitis and has four common causes:   Allergies Viral Infections Acid Reflux Bacterial Infection Allergies, viruses and acid reflux are treated by controlling symptoms or eliminating the cause. An example might be a cough caused by taking certain blood pressure medications. You stop the cough by changing the medication. Another example might be a cough caused by acid reflux. Controlling the reflux helps control the cough.  USE OF BRONCHODILATOR ("RESCUE") INHALERS: There is a risk from using your bronchodilator too frequently.  The risk is that over-reliance on a medication which only relaxes the muscles surrounding the breathing tubes can reduce the effectiveness of medications prescribed to reduce swelling and congestion of the tubes themselves.  Although you feel brief relief from the bronchodilator inhaler, your asthma may actually be worsening with the tubes becoming more swollen and filled with mucus.  This can delay other crucial treatments, such as oral steroid medications. If you need to use a bronchodilator inhaler daily, several times per day, you should discuss this with your provider.  There are probably better  treatments that could be used to keep your asthma under control.     HOME CARE Only take medications as instructed by your medical team. Complete the entire course of an antibiotic. Drink plenty of fluids and get plenty of rest. Avoid close contacts especially the very young and the elderly Cover your mouth if you cough or cough into your sleeve. Always remember to wash your hands A steam or ultrasonic humidifier can help congestion.   GET HELP RIGHT AWAY IF: You develop worsening fever. You become short of breath You cough up blood. Your symptoms persist after you have completed your treatment plan MAKE SURE YOU  Understand these instructions. Will watch your condition. Will get help right away if you are not doing well or get worse.    Thank you for choosing an e-visit.  Your e-visit answers were reviewed by a board certified advanced clinical practitioner to complete your personal care plan. Depending upon the condition, your plan could have included both over the counter or prescription medications.  Please review your pharmacy choice. Make sure the pharmacy is open so you can pick up prescription now. If there is a problem, you may contact your provider through [employer] and have the prescription routed to another pharmacy.  Your safety is important to [place]. If you have drug allergies check your prescription carefully.   For the next 24 hours you can use MyChart to ask questions about today's visit, request a non-urgent call back, or ask for a work or school excuse. You will get an email in the  next two days asking about your experience. I hope that your e-visit has been valuable and will speed your recovery.   I have spent 5 minutes in review of e-visit questionnaire, review and updating patient chart, medical decision making and response to patient.   Margaretann Loveless, PA-C

## 2023-08-17 ENCOUNTER — Other Ambulatory Visit: Payer: Self-pay

## 2023-08-17 ENCOUNTER — Ambulatory Visit
Admission: EM | Admit: 2023-08-17 | Discharge: 2023-08-17 | Disposition: A | Discharge status: Home / Self Care | Payer: 59 | Attending: Emergency Medicine | Admitting: Emergency Medicine

## 2023-08-17 ENCOUNTER — Encounter: Payer: Self-pay | Admitting: *Deleted

## 2023-08-17 DIAGNOSIS — R058 Other specified cough: Secondary | ICD-10-CM

## 2023-08-17 MED ORDER — AZITHROMYCIN 250 MG PO TABS: 250.0000 mg | ORAL_TABLET | ORAL | 0 refills | Status: DC

## 2023-08-17 NOTE — ED Provider Notes (Signed)
[facility]    CSN: [id number] Arrival date & time: 08/17/23  1250     History   Chief Complaint Chief Complaint  Patient presents with   Cough   Otalgia    HPI Sylvia Salinas is a 42 y.o. female.  Here with almost 2-week history of cough, productive. She works at a school, several sick contacts Had fever at the beginning of symptoms but none recently This morning woke up with bilateral ear pain  Did an e-visit the other day and was given Promethazine DM, albuterol.  Minimal improvement  Past Medical History:  Diagnosis Date   Acute pancreatitis 11/30/2014    Patient Active Problem List   Diagnosis Date Noted   Iron deficiency anemia due to chronic blood loss 02/21/2023   Microcytic anemia 01/06/2023   Choledocholithiasis with obstruction 12/16/2014   Calculus of bile duct with obstruction and without cholangitis or cholecystitis    Elevated liver enzymes 12/15/2014   Gallstones    Acute pancreatitis    Common bile duct dilation    Nausea with vomiting    Pancreatitis 11/30/2014    Past Surgical History:  Procedure Laterality Date   ABDOMINAL HERNIA REPAIR  2005   CESAREAN SECTION  04/2003; 01/2009   CHOLECYSTECTOMY N/A 12/02/2014   Procedure: LAPAROSCOPIC CHOLECYSTECTOMY WITH ATTEMPTED INTRAOPERATIVE CHOLANGIOGRAM;  Surgeon: Abigail Miyamoto, MD;  Location: [facility] OR;  Service: General;  Laterality: N/A;   ERCP N/A 12/16/2014   Procedure: ENDOSCOPIC RETROGRADE CHOLANGIOPANCREATOGRAPHY (ERCP);  Surgeon: Iva Boop, MD;  Location: [facility] ENDOSCOPY;  Service: Endoscopy;  Laterality: N/A;   HERNIA REPAIR  2005    OB History   No obstetric history on file.      Home Medications    Prior to Admission medications   Medication Sig Start Date End Date Taking? Authorizing Provider  albuterol (VENTOLIN HFA) 108 (90 Base) MCG/ACT inhaler Inhale 1-2 puffs into the lungs every 6 (six) hours as needed. 08/15/23  Yes Margaretann Loveless, PA-C  azithromycin  (ZITHROMAX) 250 MG tablet Take 1 tablet (250 mg total) by mouth as directed. Take 2 tablets together on day 1, then take 1 tablet daily for 4 days 08/17/23  Yes Edna Rede, Lurena Joiner, PA-C  brompheniramine-pseudoephedrine-DM 30-2-10 MG/5ML syrup Take 5 mLs by mouth 4 (four) times daily as needed. 08/15/23  Yes Margaretann Loveless, PA-C  Ferrous Sulfate 27 MG TABS     [provider]  pregabalin (LYRICA) 100 MG capsule Take 1 capsule (100 mg total) by mouth at bedtime. 02/07/23   Kirsteins, Victorino Sparrow, MD    Family History Family History  Problem Relation Age of Onset   Clotting disorder Mother        lung cancer   Stroke Mother    Stroke Father    Liver cancer Maternal Uncle    Kidney cancer Neg Hx    Alcoholism Neg Hx     Social History Social History   Tobacco Use   Smoking status: Never   Smokeless tobacco: Never  Vaping Use   Vaping status: Never Used  Substance Use Topics   Alcohol use: Not Currently    Comment: 11/30/2014 "glass of wine q couple weeks"   Drug use: No     Allergies   Patient has no known allergies.   Review of Systems Review of Systems  HENT:  Positive for ear pain.   Respiratory:  Positive for cough.    Per HPI  Physical Exam Triage Vital Signs ED Triage Vitals  Encounter Vitals Group     BP 08/17/23 1446 112/78     Systolic BP Percentile --      Diastolic BP Percentile --      Pulse Rate 08/17/23 1446 98     Resp 08/17/23 1446 16     Temp 08/17/23 1446 98.4 F (36.9 C)     Temp Source 08/17/23 1446 Oral     SpO2 08/17/23 1446 98 %     Weight --      Height --      Head Circumference --      Peak Flow --      Pain Score 08/17/23 1443 6     Pain Loc --      Pain Education --      Exclude from Growth Chart --    No data found.  Updated Vital Signs BP 112/78 (BP Location: Left Arm)   Pulse 98   Temp 98.4 F (36.9 C) (Oral)   Resp 16   LMP 08/17/2023   SpO2 98%    Physical Exam Vitals and nursing note reviewed.   Constitutional:      General: She is not in acute distress. HENT:     Right Ear: Tympanic membrane and ear canal normal.     Left Ear: Tympanic membrane and ear canal normal.     Nose: No rhinorrhea.     Mouth/Throat:     Mouth: Mucous membranes are moist.     Pharynx: Oropharynx is clear. No posterior oropharyngeal erythema.  Eyes:     Conjunctiva/sclera: Conjunctivae normal.  Cardiovascular:     Rate and Rhythm: Normal rate and regular rhythm.     Pulses: Normal pulses.     Heart sounds: Normal heart sounds.  Pulmonary:     Effort: Pulmonary effort is normal.     Breath sounds: Normal breath sounds.     Comments: Crackles heard briefly but cleared after cough Musculoskeletal:     Cervical back: Normal range of motion.  Lymphadenopathy:     Cervical: No cervical adenopathy.  Skin:    General: Skin is warm and dry.  Neurological:     Mental Status: She is alert and oriented to person, place, and time.     UC Treatments / Results  Labs (all labs ordered are listed, but only abnormal results are displayed) Labs Reviewed - No data to display  EKG  Radiology No results found.  Procedures Procedures   Medications Ordered in UC Medications - No data to display  Initial Impression / Assessment and Plan / UC Course  I have reviewed the triage vital signs and the nursing notes.  Pertinent labs & imaging results that were available during my care of the patient were reviewed by me and considered in my medical decision making (see chart for details).  Afebrile, well-appearing, clear lungs No sign of ear infection on exam With almost 2-week history of cough, cover for atypical infection with azithromycin.  Advised continuing other symptomatic care as needed.  Return and ED precautions discussed Work note provided Final Clinical Impressions(s) / UC Diagnoses   Final diagnoses:  Productive cough     Discharge Instructions      Please take the antibiotic as  directed, with food to avoid upset stomach Continue the cough medicine and inhaler that were previously prescribed Monitor symptoms, please return if needed!     ED Prescriptions     Medication Sig Dispense Auth. Provider   azithromycin (ZITHROMAX) 250 MG  tablet Take 1 tablet (250 mg total) by mouth as directed. Take 2 tablets together on day 1, then take 1 tablet daily for 4 days 6 tablet Drequan Ironside, Lurena Joiner, PA-C      PDMP not reviewed this encounter.   Leatha Rohner, Lurena Joiner, [place] 08/17/23 1521

## 2023-08-17 NOTE — Discharge Instructions (Addendum)
Please take the antibiotic as directed, with food to avoid upset stomach Continue the cough medicine and inhaler that were previously prescribed Monitor symptoms, please return if needed!

## 2023-08-17 NOTE — ED Triage Notes (Signed)
Pt reports fever 10 days ago, now resolved. Reports productive cough since then. Here today for right ear pain

## 2023-09-02 ENCOUNTER — Telehealth: Payer: 59 | Admitting: Physician Assistant

## 2023-09-02 DIAGNOSIS — B9689 Other specified bacterial agents as the cause of diseases classified elsewhere: Secondary | ICD-10-CM | POA: Diagnosis not present

## 2023-09-02 DIAGNOSIS — J019 Acute sinusitis, unspecified: Secondary | ICD-10-CM | POA: Diagnosis not present

## 2023-09-02 MED ORDER — AMOXICILLIN-POT CLAVULANATE 875-125 MG PO TABS: 1.0000 | ORAL_TABLET | Freq: Two times a day (BID) | ORAL | 0 refills | Status: DC

## 2023-09-02 NOTE — Progress Notes (Signed)
I have spent 5 minutes in review of e-visit questionnaire, review and updating patient chart, medical decision making and response to patient.   Mia Milan Cody Jacklynn Dehaas, PA-C    

## 2023-09-02 NOTE — Progress Notes (Signed)

## 2023-12-11 ENCOUNTER — Telehealth: Admitting: Physician Assistant

## 2023-12-11 ENCOUNTER — Encounter: Payer: Self-pay | Admitting: Hematology

## 2023-12-11 DIAGNOSIS — B9789 Other viral agents as the cause of diseases classified elsewhere: Secondary | ICD-10-CM

## 2023-12-11 DIAGNOSIS — J019 Acute sinusitis, unspecified: Secondary | ICD-10-CM | POA: Diagnosis not present

## 2023-12-11 MED ORDER — IPRATROPIUM BROMIDE 0.03 % NA SOLN: 2.0000 | Freq: Two times a day (BID) | NASAL | 0 refills | Status: DC

## 2023-12-11 NOTE — Progress Notes (Signed)

## 2023-12-11 NOTE — Progress Notes (Signed)
 I have spent 5 minutes in review of e-visit questionnaire, review and updating patient chart, medical decision making and response to patient.   Piedad Climes, PA-C

## 2024-01-05 ENCOUNTER — Telehealth: Admitting: Physician Assistant

## 2024-01-05 DIAGNOSIS — R3989 Other symptoms and signs involving the genitourinary system: Secondary | ICD-10-CM

## 2024-01-05 MED ORDER — CEPHALEXIN 500 MG PO CAPS: 500.0000 mg | ORAL_CAPSULE | Freq: Two times a day (BID) | ORAL | 0 refills | Status: DC

## 2024-01-05 NOTE — Progress Notes (Signed)

## 2024-05-25 ENCOUNTER — Ambulatory Visit
Admission: RE | Admit: 2024-05-25 | Discharge: 2024-05-25 | Disposition: A | Discharge status: Home / Self Care | Source: Ambulatory Visit | Attending: Emergency Medicine | Admitting: Emergency Medicine

## 2024-05-25 VITALS — BP 142/81 | HR 95 | Temp 98.8°F | Resp 17

## 2024-05-25 DIAGNOSIS — U071 COVID-19: Secondary | ICD-10-CM | POA: Diagnosis not present

## 2024-05-25 LAB — POC SOFIA SARS ANTIGEN FIA: SARS Coronavirus 2 Ag: POSITIVE — AB

## 2024-05-25 NOTE — Discharge Instructions (Addendum)
 Your covid test was positive. Push fluids, may use OTC meds for symptom management (sudafed or mucinex, tylenol /ibuprofen, chloraseptic throat lozenges, etc).This is a viral illness and will need to run its course. The recommendations suggest returning to normal activities when, for at least 24 hours, symptoms are improving overall, and if a fever was present, it has been gone without use of a fever-reducing medication.  Once people resume normal activities, they are encouraged to take additional prevention strategies for the next 5 days to curb disease spread, such as taking more steps for cleaner air, enhancing hygiene practices, wearing a well-fitting mask, keeping a distance from others, and/or getting tested for respiratory viruses

## 2024-05-25 NOTE — ED Triage Notes (Addendum)
 Pt c/o cough, chills, stomach pain, sore throat. States started with sore throat and progressed. She has had diarrhea and nausea today.  Pt did covid/flu test at [facility] on Sunday that was negative.

## 2024-05-25 NOTE — ED Provider Notes (Signed)
 [facility]    CSN: [id number] Arrival date & time: 05/25/24  1028      History   Chief Complaint Chief Complaint  Patient presents with   Diarrhea    Sore throat, cough, chills, and stomach pain for the last 16 hours - Entered by patient    HPI Sylvia Salinas is a 43 y.o. female.   43 year old female, Sylvia Salinas, presents to urgent care for evaluation of cough, chills , stomach pains,  since yesterday. Unknown illness exposure.  Pt did covid test 2 days prior was negative  The history is provided by the patient. No language interpreter was used.    Past Medical History:  Diagnosis Date   Acute pancreatitis 11/30/2014    Patient Active Problem List   Diagnosis Date Noted   Iron deficiency anemia due to chronic blood loss 02/21/2023   Microcytic anemia 01/06/2023   Choledocholithiasis with obstruction 12/16/2014   Calculus of bile duct with obstruction and without cholangitis or cholecystitis    Elevated liver enzymes 12/15/2014   Gallstones    Acute pancreatitis    Common bile duct dilation    Nausea with vomiting    Pancreatitis 11/30/2014    Past Surgical History:  Procedure Laterality Date   ABDOMINAL HERNIA REPAIR  2005   CESAREAN SECTION  04/2003; 01/2009   CHOLECYSTECTOMY N/A 12/02/2014   Procedure: LAPAROSCOPIC CHOLECYSTECTOMY WITH ATTEMPTED INTRAOPERATIVE CHOLANGIOGRAM;  Surgeon: Vicenta Poli, MD;  Location: [facility] OR;  Service: General;  Laterality: N/A;   ERCP N/A 12/16/2014   Procedure: ENDOSCOPIC RETROGRADE CHOLANGIOPANCREATOGRAPHY (ERCP);  Surgeon: Lupita FORBES Commander, MD;  Location: [facility] ENDOSCOPY;  Service: Endoscopy;  Laterality: N/A;   HERNIA REPAIR  2005    OB History   No obstetric history on file.      Home Medications    Prior to Admission medications   Medication Sig Start Date End Date Taking? Authorizing Provider  cephALEXin  (KEFLEX ) 500 MG capsule Take 1 capsule (500 mg total) by mouth 2 (two) times daily. Patient  not taking: Reported on 05/25/2024 01/05/24   Vivienne Delon HERO, PA-C    Family History Family History  Problem Relation Age of Onset   Clotting disorder Mother        lung cancer   Stroke Mother    Stroke Father    Liver cancer Maternal Uncle    Kidney cancer Neg Hx    Alcoholism Neg Hx     Social History Social History   Tobacco Use   Smoking status: Never   Smokeless tobacco: Never  Vaping Use   Vaping status: Never Used  Substance Use Topics   Alcohol use: Not Currently    Comment: 11/30/2014 glass of wine q couple weeks   Drug use: No     Allergies   Patient has no known allergies.   Review of Systems Review of Systems  Constitutional:  Positive for chills.  HENT:  Positive for sore throat.   Respiratory:  Positive for cough.   Gastrointestinal:  Positive for diarrhea.       Stomach ache  All other systems reviewed and are negative.    Physical Exam Triage Vital Signs ED Triage Vitals  Encounter Vitals Group     BP      Girls Systolic BP Percentile      Girls Diastolic BP Percentile      Boys Systolic BP Percentile      Boys Diastolic BP Percentile  Pulse      Resp      Temp      Temp src      SpO2      Weight      Height      Head Circumference      Peak Flow      Pain Score      Pain Loc      Pain Education      Exclude from Growth Chart    No data found.  Updated Vital Signs BP (!) 142/81 (BP Location: Right Arm)   Pulse 95   Temp 98.8 F (37.1 C) (Oral)   Resp 17   LMP 05/08/2024 (Approximate)   SpO2 98%   Visual Acuity Right Eye Distance:   Left Eye Distance:   Bilateral Distance:    Right Eye Near:   Left Eye Near:    Bilateral Near:     Physical Exam Vitals and nursing note reviewed.  Constitutional:      General: She is not in acute distress.    Appearance: She is well-developed and well-groomed.  HENT:     Head: Normocephalic.     Right Ear: Tympanic membrane is retracted.     Left Ear: Tympanic  membrane is retracted.     Nose: Congestion present.     Mouth/Throat:     Lips: Pink.     Mouth: Mucous membranes are moist.     Pharynx: Oropharynx is clear.  Eyes:     General: Lids are normal.     Conjunctiva/sclera: Conjunctivae normal.     Pupils: Pupils are equal, round, and reactive to light.  Neck:     Trachea: No tracheal deviation.  Cardiovascular:     Rate and Rhythm: Normal rate and regular rhythm.     Heart sounds: Normal heart sounds. No murmur heard. Pulmonary:     Effort: Pulmonary effort is normal.     Breath sounds: Normal breath sounds and air entry.  Abdominal:     General: Bowel sounds are normal.     Palpations: Abdomen is soft.     Tenderness: There is no abdominal tenderness.  Musculoskeletal:        General: Normal range of motion.     Cervical back: Normal range of motion.  Lymphadenopathy:     Cervical: No cervical adenopathy.  Skin:    General: Skin is warm and dry.     Findings: No rash.  Neurological:     General: No focal deficit present.     Mental Status: She is alert and oriented to person, place, and time.     GCS: GCS eye subscore is 4. GCS verbal subscore is 5. GCS motor subscore is 6.  Psychiatric:        Attention and Perception: Attention normal.        Mood and Affect: Mood normal.        Speech: Speech normal.        Behavior: Behavior normal. Behavior is cooperative.      UC Treatments / Results  Labs (all labs ordered are listed, but only abnormal results are displayed) Labs Reviewed  POC SOFIA SARS ANTIGEN FIA - Abnormal; Notable for the following components:      Result Value   SARS Coronavirus 2 Ag Positive (*)    All other components within normal limits    EKG   Radiology No results found.  Procedures Procedures (including critical care time)  Medications Ordered  in UC Medications - No data to display  Initial Impression / Assessment and Plan / UC Course  I have reviewed the triage vital signs and the  nursing notes.  Pertinent labs & imaging results that were available during my care of the patient were reviewed by me and considered in my medical decision making (see chart for details).    Discussed exam findings and plan of care with patient, covid +, strict go to ER precautions given.   Patient verbalized understanding to this provider.  Ddx: Covid, viral URI, allergies Final Clinical Impressions(s) / UC Diagnoses   Final diagnoses:  COVID     Discharge Instructions      Your covid test was positive. Push fluids, may use OTC meds for symptom management (sudafed or mucinex, tylenol /ibuprofen, chloraseptic throat lozenges, etc).This is a viral illness and will need to run its course. The recommendations suggest returning to normal activities when, for at least 24 hours, symptoms are improving overall, and if a fever was present, it has been gone without use of a fever-reducing medication.  Once people resume normal activities, they are encouraged to take additional prevention strategies for the next 5 days to curb disease spread, such as taking more steps for cleaner air, enhancing hygiene practices, wearing a well-fitting mask, keeping a distance from others, and/or getting tested for respiratory viruses     ED Prescriptions   None    PDMP not reviewed this encounter.   Aminta Loose, NP 05/25/24 1420

## 2024-08-07 ENCOUNTER — Telehealth: Admitting: Family Medicine

## 2024-08-07 DIAGNOSIS — K1379 Other lesions of oral mucosa: Secondary | ICD-10-CM | POA: Diagnosis not present

## 2024-08-07 MED ORDER — CHLORHEXIDINE GLUCONATE 0.12 % MT SOLN: 15.0000 mL | Freq: Two times a day (BID) | OROMUCOSAL | 0 refills | Status: AC

## 2024-08-07 NOTE — Progress Notes (Signed)

## 2024-09-22 ENCOUNTER — Encounter: Payer: Self-pay | Admitting: Hematology

## 2024-09-22 ENCOUNTER — Ambulatory Visit
Admission: RE | Admit: 2024-09-22 | Discharge: 2024-09-22 | Disposition: A | Discharge status: Home / Self Care | Attending: Family Medicine | Admitting: Family Medicine

## 2024-09-22 VITALS — BP 129/80 | HR 95 | Temp 98.6°F | Resp 17

## 2024-09-22 DIAGNOSIS — H6123 Impacted cerumen, bilateral: Secondary | ICD-10-CM

## 2024-09-22 DIAGNOSIS — J309 Allergic rhinitis, unspecified: Secondary | ICD-10-CM | POA: Diagnosis not present

## 2024-09-22 MED ORDER — FEXOFENADINE HCL 180 MG PO TABS: 180.0000 mg | ORAL_TABLET | Freq: Every day | ORAL | 0 refills | Status: AC

## 2024-09-22 NOTE — Discharge Instructions (Addendum)
 Advised patient take medication as directed with food daily for the next 5 days.  Advised patient may use Allegra  as needed for concurrent postnasal drainage/drip/runny nose/sneezing/congestion.  Encouraged increase daily water intake to 64 ounces per day while taking this medication.  Advised if symptoms worsen and/or unresolved please follow-up with your PCP or here for further evaluation.

## 2024-09-22 NOTE — ED Triage Notes (Signed)
 Pt c/o cough, congestion, HA and ear pain/pressure (some drainage from LT) that started last night. No known fever.

## 2024-09-22 NOTE — ED Provider Notes (Signed)
 " Sylvia Salinas    CSN: [id number] Arrival date & time: 09/22/24  1140      History   Chief Complaint Chief Complaint  Patient presents with   Cough   Nasal Congestion   Otalgia    HPI Sylvia Salinas is a 44 y.o. female.   HPI 44 year old female presents with cough, congestion, headache ear pain and pressure with drainage from left ear that started last night.SABRA  PMH significant for cholelithiasis with obstruction, acute pancreatitis, and elevated liver enzymes.  Past Medical History:  Diagnosis Date   Acute pancreatitis 11/30/2014    Patient Active Problem List   Diagnosis Date Noted   Iron deficiency anemia due to chronic blood loss 02/21/2023   Microcytic anemia 01/06/2023   Choledocholithiasis with obstruction 12/16/2014   Calculus of bile duct with obstruction and without cholangitis or cholecystitis    Elevated liver enzymes 12/15/2014   Gallstones    Acute pancreatitis    Common bile duct dilation    Nausea with vomiting    Pancreatitis 11/30/2014    Past Surgical History:  Procedure Laterality Date   ABDOMINAL HERNIA REPAIR  2005   CESAREAN SECTION  04/2003; 01/2009   CHOLECYSTECTOMY N/A 12/02/2014   Procedure: LAPAROSCOPIC CHOLECYSTECTOMY WITH ATTEMPTED INTRAOPERATIVE CHOLANGIOGRAM;  Surgeon: Vicenta Poli, MD;  Location: [facility] OR;  Service: General;  Laterality: N/A;   ERCP N/A 12/16/2014   Procedure: ENDOSCOPIC RETROGRADE CHOLANGIOPANCREATOGRAPHY (ERCP);  Surgeon: Lupita FORBES Commander, MD;  Location: [facility] ENDOSCOPY;  Service: Endoscopy;  Laterality: N/A;   HERNIA REPAIR  2005    OB History   No obstetric history on file.      Home Medications    Prior to Admission medications  Medication Sig Start Date End Date Taking? Authorizing Provider  fexofenadine  (ALLEGRA  ALLERGY) 180 MG tablet Take 1 tablet (180 mg total) by mouth daily. 09/22/24 10/07/24 Yes Teddy Sharper, FNP    Family History Family History  Problem Relation Age of Onset   Clotting  disorder Mother        lung cancer   Stroke Mother    Stroke Father    Liver cancer Maternal Uncle    Kidney cancer Neg Hx    Alcoholism Neg Hx     Social History Social History[1]   Allergies   Patient has no known allergies.   Review of Systems Review of Systems   Physical Exam Triage Vital Signs ED Triage Vitals  Encounter Vitals Group     BP      Girls Systolic BP Percentile      Girls Diastolic BP Percentile      Boys Systolic BP Percentile      Boys Diastolic BP Percentile      Pulse      Resp      Temp      Temp src      SpO2      Weight      Height      Head Circumference      Peak Flow      Pain Score      Pain Loc      Pain Education      Exclude from Growth Chart    No data found.  Updated Vital Signs BP 129/80 (BP Location: Right Arm)   Pulse 95   Temp 98.6 F (37 C) (Oral)   Resp 17   SpO2 100%   Visual Acuity Right Eye Distance:   Left Eye Distance:  Bilateral Distance:    Right Eye Near:   Left Eye Near:    Bilateral Near:     Physical Exam Vitals and nursing note reviewed.  Constitutional:      General: She is not in acute distress.    Appearance: Normal appearance. She is obese. She is not ill-appearing.  HENT:     Head: Normocephalic and atraumatic.     Right Ear: Tympanic membrane and external ear normal.     Left Ear: Tympanic membrane and external ear normal.     Ears:     Comments: Bilateral EAC's with moderate cerumen accumulation present    Mouth/Throat:     Mouth: Mucous membranes are moist.     Pharynx: Oropharynx is clear.     Comments: Moderate amount of clear drainage of posterior oropharynx noted Eyes:     Extraocular Movements: Extraocular movements intact.     Conjunctiva/sclera: Conjunctivae normal.     Pupils: Pupils are equal, round, and reactive to light.  Cardiovascular:     Rate and Rhythm: Normal rate and regular rhythm.     Heart sounds: Normal heart sounds.  Pulmonary:     Effort: Pulmonary  effort is normal.     Breath sounds: Normal breath sounds. No wheezing, rhonchi or rales.  Musculoskeletal:        General: Normal range of motion.  Skin:    General: Skin is warm and dry.  Neurological:     General: No focal deficit present.     Mental Status: She is alert and oriented to person, place, and time.  Psychiatric:        Mood and Affect: Mood normal.        Behavior: Behavior normal.      UC Treatments / Results  Labs (all labs ordered are listed, but only abnormal results are displayed) Labs Reviewed - No data to display  EKG   Radiology No results found.  Procedures Procedures (including critical Salinas time)  Medications Ordered in UC Medications - No data to display  Initial Impression / Assessment and Plan / UC Course  I have reviewed the triage vital signs and the nursing notes.  Pertinent labs & imaging results that were available during my Salinas of the patient were reviewed by me and considered in my medical decision making (see chart for details).     MDM: 1.  Allergic rhinitis, unspecified seasonality, unspecified trigger-Rx'd Allegra  180 mg tablet, take 1 tablet daily x 5 days; 2.  Excessive cerumen in both ear canals-patient deferred bilateral ear lavage today and chooses to perform this at another time. Advised patient take medication as directed with food daily for the next 5 days.  Advised patient may use Allegra  as needed for concurrent postnasal drainage/drip/runny nose/sneezing/congestion.  Encouraged increase daily water intake to 64 ounces per day while taking this medication.  Advised if symptoms worsen and/or unresolved please follow-up with your PCP or here for further evaluation.  Patient discharged home, hemodynamically stable. Final Clinical Impressions(s) / UC Diagnoses   Final diagnoses:  Excessive cerumen in both ear canals  Allergic rhinitis, unspecified seasonality, unspecified trigger     Discharge Instructions       Advised patient take medication as directed with food daily for the next 5 days.  Advised patient may use Allegra  as needed for concurrent postnasal drainage/drip/runny nose/sneezing/congestion.  Encouraged increase daily water intake to 64 ounces per day while taking this medication.  Advised if symptoms worsen and/or unresolved please  follow-up with your PCP or here for further evaluation.     ED Prescriptions     Medication Sig Dispense Auth. Provider   fexofenadine  (ALLEGRA  ALLERGY) 180 MG tablet Take 1 tablet (180 mg total) by mouth daily. 15 tablet Janiqua Friscia, FNP      PDMP not reviewed this encounter.    [1]  Social History Tobacco Use   Smoking status: Never   Smokeless tobacco: Never  Vaping Use   Vaping status: Never Used  Substance Use Topics   Alcohol use: Not Currently    Comment: 11/30/2014 glass of wine q couple weeks   Drug use: No     Teddy Sharper, FNP 09/22/24 1236  "

## 2024-10-13 ENCOUNTER — Ambulatory Visit
Admission: RE | Admit: 2024-10-13 | Discharge: 2024-10-13 | Disposition: A | Discharge status: Home / Self Care | Attending: Student | Admitting: Student

## 2024-10-13 VITALS — BP 123/77 | HR 98 | Temp 98.5°F | Resp 16 | Wt 191.0 lb

## 2024-10-13 DIAGNOSIS — J069 Acute upper respiratory infection, unspecified: Secondary | ICD-10-CM

## 2024-10-13 LAB — POC COVID19/FLU A&B COMBO
Covid Antigen, POC: NEGATIVE
Influenza A Antigen, POC: NEGATIVE
Influenza B Antigen, POC: NEGATIVE

## 2024-10-13 MED ORDER — PROMETHAZINE-DM 6.25-15 MG/5ML PO SYRP: 5.0000 mL | ORAL_SOLUTION | Freq: Four times a day (QID) | ORAL | 0 refills | Status: AC | PRN

## 2024-10-13 MED ORDER — PSEUDOEPHEDRINE HCL 30 MG PO TABS: 30.0000 mg | ORAL_TABLET | ORAL | 0 refills | Status: DC | PRN

## 2024-10-13 MED ORDER — IPRATROPIUM BROMIDE 0.06 % NA SOLN: 2.0000 | Freq: Two times a day (BID) | NASAL | 1 refills | Status: AC | PRN

## 2024-10-13 NOTE — Discharge Instructions (Addendum)
-  Your COVID and influenza tests were negative. -You have a virus, like the common cold.  Viruses typically last 5 to 7 days.  After 7 days, your symptoms should be improving rather than worsening.  If your symptoms improve, and then worsen again, this is when we worry about a sinus infection or a lung infection, and you should return for additional care. -Promethazine  DM cough syrup for congestion/cough. This could make you drowsy, so take at night before bed. - Use the Sudafed, 1 pill up to every 4 hours for nasal congestion.  This medication will give you energy, so do not take right before bed. - Use the ipratropium nasal spray for nasal congestion, 2 sprays twice daily while symptoms persist.

## 2024-10-13 NOTE — ED Triage Notes (Signed)
 Pt presents with a cough, bodyaches, sore throat, runny nose, bilateral ear pain, headache and abdominal pain since yesterday. Pt has taken theraflu for her symptoms.

## 2024-10-13 NOTE — ED Provider Notes (Signed)
 " [facility]    CSN: [id number] Arrival date & time: 10/13/24  1025      History   Chief Complaint Chief Complaint  Patient presents with   Cough    Cough, headache, ear ache both ears, body aches, weakness - Entered by patient   Nasal Congestion   Headache   Sore Throat   Abdominal Pain   Otalgia    HPI Sylvia Salinas is a 44 y.o. female presenting with viral symptoms. -Endorses: Cough, myalgias, sore throat, rhinorrhea, bilateral ear pressure, headache, crampy abdominal pain, nausea without vomiting. -Denies: vomiting, diarrhea -Duration of symptoms: 2 days. -Interventions attempted: TheraFlu. -The patient denies a history of pulmonary disease. She has used an inhaler during prior illness.  -Pregnancy / breastfeeding status: states she is not pregnant. The patient is not breastfeeding.    HPI  Past Medical History:  Diagnosis Date   Acute pancreatitis 11/30/2014    Patient Active Problem List   Diagnosis Date Noted   Iron deficiency anemia due to chronic blood loss 02/21/2023   Microcytic anemia 01/06/2023   Choledocholithiasis with obstruction 12/16/2014   Calculus of bile duct with obstruction and without cholangitis or cholecystitis    Elevated liver enzymes 12/15/2014   Gallstones    Acute pancreatitis    Common bile duct dilation    Nausea with vomiting    Pancreatitis 11/30/2014    Past Surgical History:  Procedure Laterality Date   ABDOMINAL HERNIA REPAIR  2005   CESAREAN SECTION  04/2003; 01/2009   CHOLECYSTECTOMY N/A 12/02/2014   Procedure: LAPAROSCOPIC CHOLECYSTECTOMY WITH ATTEMPTED INTRAOPERATIVE CHOLANGIOGRAM;  Surgeon: Vicenta Poli, MD;  Location: [facility] OR;  Service: General;  Laterality: N/A;   ERCP N/A 12/16/2014   Procedure: ENDOSCOPIC RETROGRADE CHOLANGIOPANCREATOGRAPHY (ERCP);  Surgeon: Lupita FORBES Commander, MD;  Location: [facility] ENDOSCOPY;  Service: Endoscopy;  Laterality: N/A;   HERNIA REPAIR  2005    OB History   No obstetric  history on file.      Home Medications    Prior to Admission medications  Medication Sig Start Date End Date Taking? Authorizing Provider  ipratropium (ATROVENT ) 0.06 % nasal spray Place 2 sprays into both nostrils 2 (two) times daily as needed for rhinitis. 10/13/24  Yes Kissy Cielo E, PA-C  promethazine -dextromethorphan (PROMETHAZINE -DM) 6.25-15 MG/5ML syrup Take 5 mLs by mouth 4 (four) times daily as needed for cough. 10/13/24  Yes Creed Kail E, PA-C  pseudoephedrine  (SUDAFED) 30 MG tablet Take 1 tablet (30 mg total) by mouth every 4 (four) hours as needed for congestion. 10/13/24  Yes Moxie Kalil E, PA-C  fexofenadine  (ALLEGRA  ALLERGY) 180 MG tablet Take 1 tablet (180 mg total) by mouth daily. 09/22/24 10/07/24  Teddy Sharper, FNP    Family History Family History  Problem Relation Age of Onset   Clotting disorder Mother        lung cancer   Stroke Mother    Stroke Father    Liver cancer Maternal Uncle    Kidney cancer Neg Hx    Alcoholism Neg Hx     Social History Social History[1]   Allergies   Patient has no known allergies.   Review of Systems Review of Systems  Constitutional:  Negative for appetite change, chills and fever.  HENT:  Positive for congestion, ear pain and sore throat. Negative for rhinorrhea, sinus pressure and sinus pain.   Eyes:  Negative for redness and visual disturbance.  Respiratory:  Positive for cough. Negative for chest tightness, shortness of  breath and wheezing.   Cardiovascular:  Negative for chest pain and palpitations.  Gastrointestinal:  Negative for abdominal pain, constipation, diarrhea, nausea and vomiting.  Genitourinary:  Negative for dysuria, frequency and urgency.  Musculoskeletal:  Positive for myalgias.  Neurological:  Positive for headaches. Negative for dizziness and weakness.  Psychiatric/Behavioral:  Negative for confusion.   All other systems reviewed and are negative.    Physical Exam Triage Vital Signs ED  Triage Vitals  Encounter Vitals Group     BP 10/13/24 1044 123/77     Girls Systolic BP Percentile --      Girls Diastolic BP Percentile --      Boys Systolic BP Percentile --      Boys Diastolic BP Percentile --      Pulse Rate 10/13/24 1044 98     Resp 10/13/24 1044 16     Temp 10/13/24 1044 98.5 F (36.9 C)     Temp Source 10/13/24 1044 Oral     SpO2 10/13/24 1044 98 %     Weight 10/13/24 1041 191 lb (86.6 kg)     Height --      Head Circumference --      Peak Flow --      Pain Score 10/13/24 1043 8     Pain Loc --      Pain Education --      Exclude from Growth Chart --    No data found.  Updated Vital Signs BP 123/77 (BP Location: Left Arm)   Pulse 98   Temp 98.5 F (36.9 C) (Oral)   Resp 16   Wt 191 lb (86.6 kg)   LMP 09/21/2024   SpO2 98%   BMI 31.78 kg/m   Visual Acuity Right Eye Distance:   Left Eye Distance:   Bilateral Distance:    Right Eye Near:   Left Eye Near:    Bilateral Near:     Physical Exam Vitals reviewed.  Constitutional:      General: She is not in acute distress.    Appearance: Normal appearance. She is not ill-appearing.  HENT:     Head: Normocephalic and atraumatic.     Right Ear: Tympanic membrane, ear canal and external ear normal. No tenderness. No middle ear effusion. There is no impacted cerumen. Tympanic membrane is not perforated, erythematous, retracted or bulging.     Left Ear: Tympanic membrane, ear canal and external ear normal. No tenderness.  No middle ear effusion. There is no impacted cerumen. Tympanic membrane is not perforated, erythematous, retracted or bulging.     Nose: Nose normal. No congestion.     Mouth/Throat:     Mouth: Mucous membranes are moist.     Pharynx: Uvula midline. No oropharyngeal exudate or posterior oropharyngeal erythema.     Tonsils: No tonsillar exudate.  Eyes:     Extraocular Movements: Extraocular movements intact.     Pupils: Pupils are equal, round, and reactive to light.   Cardiovascular:     Rate and Rhythm: Normal rate and regular rhythm.     Heart sounds: Normal heart sounds.  Pulmonary:     Effort: Pulmonary effort is normal.     Breath sounds: Normal breath sounds. No decreased breath sounds, wheezing, rhonchi or rales.  Abdominal:     Palpations: Abdomen is soft.     Tenderness: There is no abdominal tenderness. There is no guarding or rebound.  Lymphadenopathy:     Cervical: No cervical adenopathy.  Right cervical: No superficial, deep or posterior cervical adenopathy.    Left cervical: No superficial, deep or posterior cervical adenopathy.  Skin:    Comments: No rash   Neurological:     General: No focal deficit present.     Mental Status: She is alert and oriented to person, place, and time.  Psychiatric:        Mood and Affect: Mood normal.        Behavior: Behavior normal.        Thought Content: Thought content normal.        Judgment: Judgment normal.      UC Treatments / Results  Labs (all labs ordered are listed, but only abnormal results are displayed) Labs Reviewed  POC COVID19/FLU A&B COMBO - Normal    EKG   Radiology No results found.  Procedures Procedures (including critical care time)  Medications Ordered in UC Medications - No data to display  Initial Impression / Assessment and Plan / UC Course  I have reviewed the triage vital signs and the nursing notes.  Pertinent labs & imaging results that were available during my care of the patient were reviewed by me and considered in my medical decision making (see chart for details).     Patient is a pleasant 44 y.o. female presenting with viral URI with cough. The patient is afebrile and nontachycardic.  Antipyretic has not been administered today.  States she is not pregnant breast-feeding.  -Covid negative -Influenza negative  Promethazine  DM, Sudafed, and ipratropium nasal spray sent for symptomatic relief.  Continue TheraFlu during the day.  Return  precautions as below.  Final Clinical Impressions(s) / UC Diagnoses   Final diagnoses:  Viral URI with cough     Discharge Instructions      -Your COVID and influenza tests were negative. -You have a virus, like the common cold.  Viruses typically last 5 to 7 days.  After 7 days, your symptoms should be improving rather than worsening.  If your symptoms improve, and then worsen again, this is when we worry about a sinus infection or a lung infection, and you should return for additional care. -Promethazine  DM cough syrup for congestion/cough. This could make you drowsy, so take at night before bed. - Use the Sudafed, 1 pill up to every 4 hours for nasal congestion.  This medication will give you energy, so do not take right before bed. - Use the ipratropium nasal spray for nasal congestion, 2 sprays twice daily while symptoms persist.     ED Prescriptions     Medication Sig Dispense Auth. Provider   promethazine -dextromethorphan (PROMETHAZINE -DM) 6.25-15 MG/5ML syrup Take 5 mLs by mouth 4 (four) times daily as needed for cough. 118 mL Rogers Ditter E, PA-C   pseudoephedrine  (SUDAFED) 30 MG tablet Take 1 tablet (30 mg total) by mouth every 4 (four) hours as needed for congestion. 30 tablet Ladarien Beeks E, PA-C   ipratropium (ATROVENT ) 0.06 % nasal spray Place 2 sprays into both nostrils 2 (two) times daily as needed for rhinitis. 15 mL Keishawn Rajewski E, PA-C      PDMP not reviewed this encounter.     [1]  Social History Tobacco Use   Smoking status: Never   Smokeless tobacco: Never  Vaping Use   Vaping status: Never Used  Substance Use Topics   Alcohol use: Not Currently    Comment: 11/30/2014 glass of wine q couple weeks   Drug use: No     Arlyss,  Leita BRAVO, PA-C 10/13/24 1122  "

## 2024-10-14 ENCOUNTER — Other Ambulatory Visit: Payer: Self-pay

## 2024-10-14 ENCOUNTER — Telehealth (INDEPENDENT_AMBULATORY_CARE_PROVIDER_SITE_OTHER): Payer: Self-pay | Admitting: Primary Care

## 2024-10-14 DIAGNOSIS — D509 Iron deficiency anemia, unspecified: Secondary | ICD-10-CM

## 2024-10-14 DIAGNOSIS — D500 Iron deficiency anemia secondary to blood loss (chronic): Secondary | ICD-10-CM

## 2024-10-14 NOTE — Progress Notes (Signed)
 " Patient Care Team: Celestia Rosaline SQUIBB, NP as PCP - General (Internal Medicine)  Clinic Day:  10/15/2024  Referring physician: No ref. provider found  ASSESSMENT & PLAN:   Assessment & Plan: Microcytic anemia Patient presented to emergency room on March 22, and again December 15, 2022 for left-sided sciatic pain.  Lab obtained during her ED visit, CBC showed hemoglobin 7.6, hematocrit 28.8%, MCV 70.6, platelet 477.  According to epic records, she had normal CBC in 2016, and a mild anemia with hemoglobin 11.6 after cholecystectomy.  MCV was previously normal.  Pt state d that this is her first time being anemic.  10/15/2024 -patient presented with severe microcytic anemia with Hgb 6.5 and HCT 26.1.  Reticulocyte panel was remarkable for increased immature reticulocytes and decreased reticulocyte hemoglobin.  At time of visit, B12 and ferritin levels were still pending.  Due to expected inclement weather, she was advised to have ED visit.  This would be best and fastest way to receive blood transfusion was needed.  Treatment plan for IV Venofer  200 mg sent to [facility] infusion center.  She should receive IV iron  weekly for 5 treatments.  The referral to GI was made for further evaluation of cause of more severe microcytic anemia.   Microcytic anemia Upon arrival today, patient significantly anemic.  Her Hgb is 4.5, HCT 26.1, MCV is 63.8.  Reticulocyte panel is notable for elevated immature reticulocytes and decreased reticulocyte hemoglobin.  B12 and ferritin levels are pending.  Thus far, she has not required blood transfusions, thus blood bank ID was not obtained.  Due to expectation of improvement weather, advised patient that ED visit would be the best and fastest way for her to obtain needed blood transfusion.  She was agreeable.  ED charge nurse, Odetta, was contacted.  The patient was advised to report to ED check-in and they would get her into an exam room as soon as possible.  In addition to  blood transfusion, treatment plan orders will be sent to [facility] infusion center for IV iron .  A referral to GI provider will be made for further evaluation.  Plan for labs and follow-up visit in 6 weeks.   The patient understands the plans discussed today and is in agreement with them.  She knows to contact our office if she develops concerns prior to her next appointment.  I provided 25 minutes of face-to-face time during this encounter and > 50% was spent counseling as documented under my assessment and plan.    Sylvia FORBES Lessen, NP  [facility] - A DEPT OF [facility] [address] [place] [address] Dept: [phone] Dept Fax: [phone]   Orders Placed This Encounter  Procedures   Ambulatory referral to Gastroenterology    Referral Priority:   Routine    Referral Type:   Consultation    Referral Reason:   Specialty Services Required    Number of Visits Requested:   1      CHIEF COMPLAINT:  CC: Microcytic anemia  Current Treatment: Oral iron /IV iron  as needed  INTERVAL HISTORY:  Sylvia Salinas is here today for repeat clinical assessment.  She was last seen by Dr. Lanny in 01/2023.  Patient states for the last 3 months, she has been feeling very poorly.  She is fatigued, dizzy, short of breath, and irritable.  She states that her symptoms have been so severe, it is difficult for her  to work.  Has had to leave work early work or call in sick.  She also reports intermittent hot flashes.  States that she continues to have menstrual periods however, they are coming further apart.  They only last for 2 to 3 days.  Menstrual cycles are not as heavy as they used to be.  She has not noticed other bleeding.  She denies blood in her stool, hematuria, hematemesis, or hemoptysis.  She has not had unusual nosebleeds or bruising.  She denies chest pain, chest pressure, or palpitations.  She denies headaches visual disturbance.   She denies abdominal pain, constipation, or diarrhea.  She denies fevers or chills. She denies pain. Her appetite is good. Her weight has been stable.  I have reviewed the past medical history, past surgical history, social history and family history with the patient and they are unchanged from previous note.  ALLERGIES:  has no known allergies.  MEDICATIONS:  Current Outpatient Medications  Medication Sig Dispense Refill   fexofenadine  (ALLEGRA  ALLERGY) 180 MG tablet Take 1 tablet (180 mg total) by mouth daily. 15 tablet 0   ipratropium (ATROVENT ) 0.06 % nasal spray Place 2 sprays into both nostrils 2 (two) times daily as needed for rhinitis. 15 mL 1   promethazine -dextromethorphan (PROMETHAZINE -DM) 6.25-15 MG/5ML syrup Take 5 mLs by mouth 4 (four) times daily as needed for cough. 118 mL 0   No current facility-administered medications for this visit.     REVIEW OF SYSTEMS:   Constitutional: Denies fevers, chills or abnormal weight loss.  Severe fatigue. Eyes: Denies blurriness of vision Ears, nose, mouth, throat, and face: Denies mucositis or sore throat Respiratory: Denies cough or wheezes.  Has experienced shortness of breath. Cardiovascular: Denies palpitation, chest discomfort or lower extremity swelling Gastrointestinal:  Denies nausea, heartburn or change in bowel habits Skin: Denies abnormal skin rashes Lymphatics: Denies new lymphadenopathy or easy bruising Neurological:Denies numbness, tingling or new weaknesses Behavioral/Psych: Mood is stable, no new changes  All other systems were reviewed with the patient and are negative.   VITALS:   Today's Vitals   10/15/24 1244  BP: 122/76  Pulse: 82  Resp: 19  Temp: 97.6 F (36.4 C)  TempSrc: Temporal  SpO2: 99%  Weight: 187 lb 6.4 oz (85 kg)  Height: 5' 4 (1.626 m)   Body mass index is 32.17 kg/m.   Wt Readings from Last 3 Encounters:  10/15/24 187 lb 6.4 oz (85 kg)  10/15/24 185 lb 12.8 oz (84.3 kg)   10/13/24 191 lb (86.6 kg)    Body mass index is 32.17 kg/m.  Performance status (ECOG): 2 - Symptomatic, <50% confined to bed  PHYSICAL EXAM:   GENERAL:alert, no distress and comfortable SKIN: skin color, texture, turgor are normal, no rashes or significant lesions EYES: normal, Conjunctiva are pink and non-injected, sclera clear OROPHARYNX:no exudate, no erythema and lips, buccal mucosa, and tongue normal  NECK: supple, thyroid normal size, non-tender, without nodularity LYMPH:  no palpable lymphadenopathy in the cervical, axillary or inguinal LUNGS: clear to auscultation and percussion with normal breathing effort HEART: regular rate & rhythm and no murmurs and no lower extremity edema ABDOMEN:abdomen soft, non-tender and normal bowel sounds Musculoskeletal:no cyanosis of digits and no clubbing  NEURO: alert & oriented x 3 with fluent speech, no focal motor/sensory deficits  LABORATORY DATA:  I have reviewed the data as listed    Component Value Date/Time   NA 137 10/15/2024 1223   K 3.8 10/15/2024 1223   CL  103 10/15/2024 1223   CO2 21 (L) 10/15/2024 1223   GLUCOSE 131 (H) 10/15/2024 1223   BUN 6 10/15/2024 1223   CREATININE 0.71 10/15/2024 1223   CALCIUM 8.7 (L) 10/15/2024 1223   PROT 7.9 10/15/2024 1223   ALBUMIN 4.2 10/15/2024 1223   AST 21 10/15/2024 1223   ALT 11 10/15/2024 1223   ALKPHOS 70 10/15/2024 1223   BILITOT 0.4 10/15/2024 1223   GFRNONAA >60 10/15/2024 1223   GFRAA >90 12/17/2014 0437   Lab Results  Component Value Date   WBC 5.7 10/15/2024   NEUTROABS 3.0 10/15/2024   HGB 6.5 (LL) 10/15/2024   HCT 26.1 (L) 10/15/2024   MCV 63.8 (L) 10/15/2024   PLT 338 10/15/2024    "

## 2024-10-14 NOTE — Telephone Encounter (Signed)
 Spoke to pt about upcoming appt.. Will be present

## 2024-10-15 ENCOUNTER — Telehealth: Payer: Self-pay | Admitting: Pharmacy Technician

## 2024-10-15 ENCOUNTER — Encounter (INDEPENDENT_AMBULATORY_CARE_PROVIDER_SITE_OTHER): Payer: Self-pay | Admitting: Primary Care

## 2024-10-15 ENCOUNTER — Inpatient Hospital Stay: Attending: Nurse Practitioner | Admitting: Nurse Practitioner

## 2024-10-15 ENCOUNTER — Other Ambulatory Visit: Payer: Self-pay

## 2024-10-15 ENCOUNTER — Encounter (HOSPITAL_COMMUNITY): Payer: Self-pay

## 2024-10-15 ENCOUNTER — Emergency Department (HOSPITAL_COMMUNITY)
Admission: EM | Admit: 2024-10-15 | Discharge: 2024-10-15 | Disposition: A | Discharge status: Home / Self Care | Source: Ambulatory Visit | Attending: Emergency Medicine | Admitting: Emergency Medicine

## 2024-10-15 ENCOUNTER — Encounter: Payer: Self-pay | Admitting: Nurse Practitioner

## 2024-10-15 ENCOUNTER — Inpatient Hospital Stay

## 2024-10-15 ENCOUNTER — Ambulatory Visit (INDEPENDENT_AMBULATORY_CARE_PROVIDER_SITE_OTHER): Payer: Self-pay | Admitting: Primary Care

## 2024-10-15 VITALS — BP 115/74 | HR 88 | Temp 98.3°F | Resp 16 | Ht 64.0 in | Wt 185.8 lb

## 2024-10-15 DIAGNOSIS — D649 Anemia, unspecified: Secondary | ICD-10-CM

## 2024-10-15 DIAGNOSIS — D509 Iron deficiency anemia, unspecified: Secondary | ICD-10-CM

## 2024-10-15 DIAGNOSIS — D500 Iron deficiency anemia secondary to blood loss (chronic): Secondary | ICD-10-CM

## 2024-10-15 DIAGNOSIS — K219 Gastro-esophageal reflux disease without esophagitis: Secondary | ICD-10-CM

## 2024-10-15 LAB — CBC WITH DIFFERENTIAL (CANCER CENTER ONLY)
Abs Immature Granulocytes: 0.01 10*3/uL (ref 0.00–0.07)
Basophils Absolute: 0 10*3/uL (ref 0.0–0.1)
Basophils Relative: 1 %
Eosinophils Absolute: 0.3 10*3/uL (ref 0.0–0.5)
Eosinophils Relative: 5 %
HCT: 26.1 % — ABNORMAL LOW (ref 36.0–46.0)
Hemoglobin: 6.5 g/dL — CL (ref 12.0–15.0)
Immature Granulocytes: 0 %
Lymphocytes Relative: 34 %
Lymphs Abs: 1.9 10*3/uL (ref 0.7–4.0)
MCH: 15.9 pg — ABNORMAL LOW (ref 26.0–34.0)
MCHC: 24.9 g/dL — ABNORMAL LOW (ref 30.0–36.0)
MCV: 63.8 fL — ABNORMAL LOW (ref 80.0–100.0)
Monocytes Absolute: 0.4 10*3/uL (ref 0.1–1.0)
Monocytes Relative: 7 %
Neutro Abs: 3 10*3/uL (ref 1.7–7.7)
Neutrophils Relative %: 53 %
Platelet Count: 338 10*3/uL (ref 150–400)
RBC: 4.09 MIL/uL (ref 3.87–5.11)
RDW: 20.9 % — ABNORMAL HIGH (ref 11.5–15.5)
WBC Count: 5.7 10*3/uL (ref 4.0–10.5)
nRBC: 0 % (ref 0.0–0.2)

## 2024-10-15 LAB — HCG, SERUM, QUALITATIVE: Preg, Serum: NEGATIVE

## 2024-10-15 LAB — URINALYSIS, ROUTINE W REFLEX MICROSCOPIC
Bilirubin Urine: NEGATIVE
Glucose, UA: NEGATIVE mg/dL
Hgb urine dipstick: NEGATIVE
Ketones, ur: NEGATIVE mg/dL
Leukocytes,Ua: NEGATIVE
Nitrite: NEGATIVE
Protein, ur: NEGATIVE mg/dL
Specific Gravity, Urine: 1.004 — ABNORMAL LOW (ref 1.005–1.030)
pH: 7 (ref 5.0–8.0)

## 2024-10-15 LAB — COMPREHENSIVE METABOLIC PANEL WITH GFR
ALT: 30 U/L (ref 0–44)
AST: 21 U/L (ref 15–41)
Albumin: 4.1 g/dL (ref 3.5–5.0)
Alkaline Phosphatase: 70 U/L (ref 38–126)
Anion gap: 9 (ref 5–15)
BUN: 5 mg/dL — ABNORMAL LOW (ref 6–20)
CO2: 23 mmol/L (ref 22–32)
Calcium: 8.9 mg/dL (ref 8.9–10.3)
Chloride: 105 mmol/L (ref 98–111)
Creatinine, Ser: 0.8 mg/dL (ref 0.44–1.00)
GFR, Estimated: >60 mL/min
Glucose, Bld: 112 mg/dL — ABNORMAL HIGH (ref 70–99)
Potassium: 4.1 mmol/L (ref 3.5–5.1)
Sodium: 137 mmol/L (ref 135–145)
Total Bilirubin: 0.3 mg/dL (ref 0.0–1.2)
Total Protein: 7.7 g/dL (ref 6.5–8.1)

## 2024-10-15 LAB — CBC
HCT: 26.2 % — ABNORMAL LOW (ref 36.0–46.0)
Hemoglobin: 6.5 g/dL — CL (ref 12.0–15.0)
MCH: 16 pg — ABNORMAL LOW (ref 26.0–34.0)
MCHC: 24.8 g/dL — ABNORMAL LOW (ref 30.0–36.0)
MCV: 64.7 fL — ABNORMAL LOW (ref 80.0–100.0)
Platelets: 359 10*3/uL (ref 150–400)
RBC: 4.05 MIL/uL (ref 3.87–5.11)
RDW: 20.8 % — ABNORMAL HIGH (ref 11.5–15.5)
WBC: 5.5 10*3/uL (ref 4.0–10.5)
nRBC: 0 % (ref 0.0–0.2)

## 2024-10-15 LAB — ABO/RH: ABO/RH(D): A POS

## 2024-10-15 LAB — CMP (CANCER CENTER ONLY)
ALT: 11 U/L (ref 0–44)
AST: 21 U/L (ref 15–41)
Albumin: 4.2 g/dL (ref 3.5–5.0)
Alkaline Phosphatase: 70 U/L (ref 38–126)
Anion gap: 12 (ref 5–15)
BUN: 6 mg/dL (ref 6–20)
CO2: 21 mmol/L — ABNORMAL LOW (ref 22–32)
Calcium: 8.7 mg/dL — ABNORMAL LOW (ref 8.9–10.3)
Chloride: 103 mmol/L (ref 98–111)
Creatinine: 0.71 mg/dL (ref 0.44–1.00)
GFR, Estimated: >60 mL/min
Glucose, Bld: 131 mg/dL — ABNORMAL HIGH (ref 70–99)
Potassium: 3.8 mmol/L (ref 3.5–5.1)
Sodium: 137 mmol/L (ref 135–145)
Total Bilirubin: 0.4 mg/dL (ref 0.0–1.2)
Total Protein: 7.9 g/dL (ref 6.5–8.1)

## 2024-10-15 LAB — RETIC PANEL
Immature Retic Fract: 16 % — ABNORMAL HIGH (ref 2.3–15.9)
RBC.: 4.04 MIL/uL (ref 3.87–5.11)
Retic Count, Absolute: 27.5 10*3/uL (ref 19.0–186.0)
Retic Ct Pct: 0.7 % (ref 0.4–3.1)
Reticulocyte Hemoglobin: 15.7 pg — ABNORMAL LOW

## 2024-10-15 LAB — VITAMIN B12: Vitamin B-12: 839 pg/mL (ref 180–914)

## 2024-10-15 LAB — FERRITIN: Ferritin: 7 ng/mL — ABNORMAL LOW (ref 11–307)

## 2024-10-15 LAB — FOLATE: Folate: 9.8 ng/mL

## 2024-10-15 LAB — CBG MONITORING, ED: Glucose-Capillary: 101 mg/dL — ABNORMAL HIGH (ref 70–99)

## 2024-10-15 MED ORDER — SODIUM CHLORIDE 0.9% IV SOLUTION: Freq: Once | INTRAVENOUS | Status: AC

## 2024-10-15 NOTE — Discharge Instructions (Addendum)
 Please follow-up with your primary doctor and outpatient team.  You received a blood today and per the previous team's plan we will discharge you home for outpatient follow-up.

## 2024-10-15 NOTE — ED Provider Notes (Signed)
 Care assumed from Dr. Dean.  At time of transfer of care, patient is awaiting completion of blood transfusion and then plan for discharge home for anemia.  Patient currently getting the type and screen and then we will get the blood and plan for discharge home after.  10:32 PM Patient was finally able to get blood transfusion which was completed.  Patient reports she is feeling well and will discharge for outpatient follow-up.  She requested a work note which we will give her.  Patient understands return precautions and follow-up instructions and was discharged in stable condition with reassuring vital signs.      Clinical Impression: 1. Symptomatic anemia   2. Iron  deficiency anemia, unspecified iron  deficiency anemia type     Disposition: Discharge  Condition: Good  I have discussed the results, Dx and Tx plan with the pt(& family if present). He/she/they expressed understanding and agree(s) with the plan. Discharge instructions discussed at great length. Strict return precautions discussed and pt &/or family have verbalized understanding of the instructions. No further questions at time of discharge.    New Prescriptions   No medications on file    Follow Up: Celestia Rosaline SQUIBB, NP [address] [place] [address] [phone]  Schedule an appointment as soon as possible for a visit    [facility] for [facility] [address] [place]  [address] [phone] Schedule an appointment as soon as possible for a visit       Poet Hineman, Lonni PARAS, MD 10/15/24 2232

## 2024-10-15 NOTE — Patient Instructions (Signed)
 Calcium Carbonate Chewable Tablets or Soft Chews What is this medication? CALCIUM CARBONATE (KAL see um KAR bon ate) treats heartburn, indigestion, upset stomach, or other conditions caused by too much stomach acid. It works by reducing the amount of acid in the stomach. It belongs to a group of medications called antacids. It may also be used to increase calcium levels in your body. Calcium is a mineral that plays an important role in building strong bones and maintaining heart health. This medicine may be used for other purposes; ask your health care provider or pharmacist if you have questions. COMMON BRAND NAME(S): Alka-Mints, Alka-Seltzer, Alka-Seltzer Heartburn Relief, Alkets, Antacid Fast Dissolve, Cal-Gest, Calcium Antacid, Maalox, Maalox Antacid Barrier, Maalox Quick Dissolve, Mylanta, Mylanta Children's, Pepto-Bismol, Pepto-Bismol Children's, Rolaids Extra Strength, Titralac, Titralac Extra Strength, Tums, Tums Chewy Bites, Tums Cool Relief, Tums E-X, Tums Freshers, Tums Kids, Tums Lasting Effects, Tums Smooth Dissolve, Tums Smoothies, Tums Ultra What should I tell my care team before I take this medication? They need to know if you have any of these conditions: Constipation Dehydration High blood calcium levels Kidney disease Stomach bleeding, obstruction, or ulcer An unusual or allergic reaction to calcium carbonate, other medications, foods, dyes, or preservatives Pregnant or trying to get pregnant Breast-feeding How should I use this medication? Take this medication by mouth. Take it as directed on the label. Chew it completely before swallowing. Do not swallow the tablets whole. Drink a glass of water after taking this medication. Antacids are usually taken after meals and at bedtime, or as directed by your care team. Do not take it more often than directed. Talk to your care team about the use of this medication in children. While it may be given to children for selected conditions,  precautions do apply. Overdosage: If you think you have taken too much of this medicine contact a poison control center or emergency room at once. NOTE: This medicine is only for you. Do not share this medicine with others. What if I miss a dose? If you miss a dose, take it as soon as you can. If it is almost time for your next dose, take only that dose. Do not take double or extra doses. What may interact with this medication? Do not take this medication with any of the following: Ammonium chloride Methenamine This medication may also interact with the following: Antibiotics like ciprofloxacin , tetracycline Captopril Delavirdine Gabapentin Iron  supplements Medications for fungal infections like ketoconazole and itraconazole Medications for seizures like ethotoin and phenytoin Mycophenolate Quinidine Rosuvastatin Sucralfate Thyroid medication This list may not describe all possible interactions. Give your health care provider a list of all the medicines, herbs, non-prescription drugs, or dietary supplements you use. Also tell them if you smoke, drink alcohol, or use illegal drugs. Some items may interact with your medicine. What should I watch for while using this medication? Tell your care team if your symptoms do not start to get better or if they get worse. Do not treat yourself for stomach problems with this medication for more than 2 weeks. See your care team if you have black tarry stools, rectal bleeding, or if you feel unusually tired. Do not change to another antacid product without advice. If you are taking other medications, leave an interval of at least 2 hours before or after taking this medication. To help reduce constipation, drink several glasses of water a day. What side effects may I notice from receiving this medication? Side effects that you should report to your  care team as soon as possible: Allergic reactions--skin rash, itching, hives, swelling of the face, lips,  tongue, or throat High calcium levels--increased thirst or amount of urine, nausea, vomiting, confusion, unusual weakness or fatigue, bone pain Side effects that usually do not require medical attention (report to your care team if they continue or are bothersome): Burping Constipation Gas This list may not describe all possible side effects. Call your doctor for medical advice about side effects. You may report side effects to FDA at 1-800-FDA-1088. Where should I keep my medication? Keep out of the reach of children and pets. Store at room temperature between 15 and 30 degrees C (59 and 86 degrees F). Throw away any unused medication after the expiration date. NOTE: This sheet is a summary. It may not cover all possible information. If you have questions about this medicine, talk to your doctor, pharmacist, or health care provider.  2024 Elsevier/Gold Standard (2022-03-25 00:00:00)

## 2024-10-15 NOTE — Telephone Encounter (Addendum)
 Auth Submission: NO AUTH NEEDED Site of care: [facility] Payer: [employer] Medication & CPT/J Code(s) submitted: Venofer  (Iron  Sucrose) J1756 Diagnosis Code: d50.9 Route of submission (phone, fax, portal):  Phone # Fax # Auth type: Buy/Bill PB Units/visits requested: 5 doses Reference number:  Approval from: 10/15/24 to 01/13/25

## 2024-10-15 NOTE — ED Provider Notes (Signed)
 " [place] [facility] AT [facility] Provider Note   CSN: [id number] Arrival date & time: 10/15/24  1321     Patient presents with: Abnormal Lab   Sylvia Salinas is a 44 y.o. female.   Pt is a 44 yo female with pmhx significant for hx pancreatitis and anemia.  Pt does say she has heavy periods, but is not on her period now.  She has been feeling sob and fatigue.  She say oncology today for anemia.  They sent her down for a blood transfusion.  Oncology also scheduled her for weekly iron  infusion and referred her to GI.  Pt denies dark or bloody stools.  She has not seen gyn for her heavy periods.         Prior to Admission medications  Medication Sig Start Date End Date Taking? Authorizing Provider  fexofenadine  (ALLEGRA  ALLERGY) 180 MG tablet Take 1 tablet (180 mg total) by mouth daily. 09/22/24 10/15/24  Ragan, Michael, FNP  ipratropium (ATROVENT ) 0.06 % nasal spray Place 2 sprays into both nostrils 2 (two) times daily as needed for rhinitis. 10/13/24   Graham, Laura E, PA-C  promethazine -dextromethorphan (PROMETHAZINE -DM) 6.25-15 MG/5ML syrup Take 5 mLs by mouth 4 (four) times daily as needed for cough. 10/13/24   Graham, Laura E, PA-C    Allergies: Patient has no known allergies.    Review of Systems  Constitutional:  Positive for fatigue.  Respiratory:  Positive for shortness of breath.   All other systems reviewed and are negative.   Updated Vital Signs BP (!) 158/99 (BP Location: Right Arm)   Pulse (!) 104   Temp 98 F (36.7 C) (Oral)   Resp 20   LMP 09/21/2024   SpO2 98%   Physical Exam Vitals and nursing note reviewed.  Constitutional:      Appearance: Normal appearance.  HENT:     Head: Normocephalic and atraumatic.     Right Ear: External ear normal.     Left Ear: External ear normal.     Nose: Nose normal.     Mouth/Throat:     Mouth: Mucous membranes are moist.     Pharynx: Oropharynx is clear.  Eyes:     Extraocular Movements:  Extraocular movements intact.     Conjunctiva/sclera: Conjunctivae normal.     Pupils: Pupils are equal, round, and reactive to light.  Cardiovascular:     Rate and Rhythm: Normal rate and regular rhythm.     Pulses: Normal pulses.     Heart sounds: Normal heart sounds.  Pulmonary:     Effort: Pulmonary effort is normal.     Breath sounds: Normal breath sounds.  Abdominal:     General: Abdomen is flat. Bowel sounds are normal.     Palpations: Abdomen is soft.  Musculoskeletal:        General: Normal range of motion.     Cervical back: Normal range of motion and neck supple.  Skin:    General: Skin is warm.     Capillary Refill: Capillary refill takes less than 2 seconds.  Neurological:     General: No focal deficit present.     Mental Status: She is alert and oriented to person, place, and time.  Psychiatric:        Mood and Affect: Mood normal.        Behavior: Behavior normal.     (all labs ordered are listed, but only abnormal results are displayed) Labs Reviewed  COMPREHENSIVE METABOLIC PANEL  WITH GFR - Abnormal; Notable for the following components:      Result Value   Glucose, Bld 112 (*)    BUN 5 (*)    All other components within normal limits  CBC - Abnormal; Notable for the following components:   Hemoglobin 6.5 (*)    HCT 26.2 (*)    MCV 64.7 (*)    MCH 16.0 (*)    MCHC 24.8 (*)    RDW 20.8 (*)    All other components within normal limits  URINALYSIS, ROUTINE W REFLEX MICROSCOPIC - Abnormal; Notable for the following components:   Color, Urine STRAW (*)    Specific Gravity, Urine 1.004 (*)    All other components within normal limits  CBG MONITORING, ED - Abnormal; Notable for the following components:   Glucose-Capillary 101 (*)    All other components within normal limits  HCG, SERUM, QUALITATIVE  TYPE AND SCREEN  PREPARE RBC (CROSSMATCH)    EKG: None  Radiology: No results found.   Procedures   Medications Ordered in the ED  0.9 %  sodium  chloride infusion (Manually program via Guardrails IV Fluids) (has no administration in time range)                                    Medical Decision Making Amount and/or Complexity of Data Reviewed Labs: ordered.  Risk Prescription drug management.   This patient presents to the ED for concern of fatigue, anemia, this involves an extensive number of treatment options, and is a complaint that carries with it a high risk of complications and morbidity.  The differential diagnosis includes iron  deficiency anemia due to gyn vs gi source   Co morbidities that complicate the patient evaluation  pancreatitis and anemia   Additional history obtained:  Additional history obtained from epic chart review   Lab Tests:  I Ordered, and personally interpreted labs.  The pertinent results include:  cbc with hgb low at 6.5; cmp nl, preg neg, ua neg; folate and b12 levels neg  Medicines ordered and prescription drug management:  I ordered medication including transfusion  for anemia  Reevaluation of the patient after these medicines showed that the patient improved I have reviewed the patients home medicines and have made adjustments as needed   Critical Interventions:  transfusion  Problem List / ED Course:  Iron  deficiency anemia/symptomatic:  pt given 1 unit prbc.  Oncology has ordered iron  infusions.  Pt referred to gi by oncology.  Due to hx of heavy periods, I am going to have her see obgyn as well.  Pt is stable for d/c.  Return if worse.  F/u with pcp/gi/gyn.   Reevaluation:  After the interventions noted above, I reevaluated the patient and found that they have :improved   Social Determinants of Health:  Lives at home   Dispostion:  After consideration of the diagnostic results and the patients response to treatment, I feel that the patent would benefit from discharge with outpatient f/u.  CRITICAL CARE Performed by: Mliss Boyers   Total critical care  time: 30 minutes  Critical care time was exclusive of separately billable procedures and treating other patients.  Critical care was necessary to treat or prevent imminent or life-threatening deterioration.  Critical care was time spent personally by me on the following activities: development of treatment plan with patient and/or surrogate as well as nursing, discussions with consultants,  evaluation of patient's response to treatment, examination of patient, obtaining history from patient or surrogate, ordering and performing treatments and interventions, ordering and review of laboratory studies, ordering and review of radiographic studies, pulse oximetry and re-evaluation of patient's condition.        Final diagnoses:  Symptomatic anemia  Iron  deficiency anemia, unspecified iron  deficiency anemia type    ED Discharge Orders     None          Dean Clarity, MD 10/15/24 1633  "

## 2024-10-15 NOTE — ED Triage Notes (Deleted)
 Pt presents with injuries from a fall yesterday when she slipped on the ice. She hit the back of her head on the icy gravel driveway. Denies LOC. Since then pt has had a severe HA, memory problems, brain fog, N/V, and fatigue. She called her PCP and she was referred to the ED.

## 2024-10-15 NOTE — ED Notes (Signed)
 Called blood bank for update on blood. Stated they called at 1840 to day shift secretary to notify of ready status. Was not passed on to previous paramedic in assignment.

## 2024-10-15 NOTE — Progress Notes (Signed)
 Critical lab value reported:  Hbg 6.5 no BB Hold - Notified Powell Lessen, NP of pt's lab  @1310  - Spoke with Patty CN for [facility] ED regarding pt's anemia.  Patty's aware of the pt being sent to ED. She stated to have the pt can check at the front desk of the ED and they will get the pt back as soon as possible. Powell Lessen, NP made aware via Secure Chat.

## 2024-10-15 NOTE — ED Triage Notes (Signed)
 Pt reports MD sent her over for a blood transfusion, hx chronic anemia , pt notes HGB was 6.8 , pt does endorse x months she has been dizzy with SOB, pt RR even and unlabored, talking in full sentences, ambulatory with no acute distress noted. Pt will be placed in wheelchair.

## 2024-10-15 NOTE — Progress Notes (Unsigned)
" ° °  Subjective:   Sylvia Salinas is a 44 y.o. female presents for Urgent care follow up.  She presented on 10/13/2024 next with complaints of cough, nasal congestion, headache, sore throat, abdominal pain and all otalgia.  She was diagnosed with a viral upper respiratory infection with cough.  She was negative for COVID and the flu.  She has an appointment today with her oncologist and blood work is already pending.  Today she has the same c/o  Past Medical History:  Diagnosis Date   Acute pancreatitis 11/30/2014     Allergies[1]  Medications Ordered Prior to Encounter[2]  Review of System: ROS Comprehensive ROS Pertinent positive and negative noted in HPI   Objective:  BP 115/74   Pulse 88   Temp 98.3 F (36.8 C)   Resp 16   Ht 5' 4 (1.626 m)   Wt 185 lb 12.8 oz (84.3 kg)   LMP 09/21/2024   SpO2 100%   BMI 31.89 kg/m   Filed Weights   10/15/24 1042  Weight: 185 lb 12.8 oz (84.3 kg)    Physical Exam Vitals reviewed.  Constitutional:      Appearance: Normal appearance. She is obese.  HENT:     Head: Normocephalic.     Right Ear: Tympanic membrane, ear canal and external ear normal.     Left Ear: Tympanic membrane, ear canal and external ear normal.     Nose: Nose normal.     Mouth/Throat:     Mouth: Mucous membranes are moist.  Eyes:     Extraocular Movements: Extraocular movements intact.     Pupils: Pupils are equal, round, and reactive to light.  Cardiovascular:     Rate and Rhythm: Normal rate.  Pulmonary:     Effort: Pulmonary effort is normal.     Breath sounds: Normal breath sounds.  Abdominal:     General: Bowel sounds are normal.     Palpations: Abdomen is soft.  Musculoskeletal:        General: Normal range of motion.     Cervical back: Normal range of motion and neck supple.  Skin:    General: Skin is warm and dry.  Neurological:     Mental Status: She is alert and oriented to person, place, and time.  Psychiatric:        Mood and Affect:  Mood normal.        Behavior: Behavior normal.        Thought Content: Thought content normal.      Assessment:  There are no diagnoses linked to this encounter.  This note has been created with Education officer, environmental. Any transcriptional errors are unintentional.   No follow-ups on file.  Rosaline SHAUNNA Bohr, NP 10/15/2024, 11:22 AM       [1] No Known Allergies [2]  Current Outpatient Medications on File Prior to Visit  Medication Sig Dispense Refill   fexofenadine  (ALLEGRA  ALLERGY) 180 MG tablet Take 1 tablet (180 mg total) by mouth daily. 15 tablet 0   ipratropium (ATROVENT ) 0.06 % nasal spray Place 2 sprays into both nostrils 2 (two) times daily as needed for rhinitis. 15 mL 1   promethazine -dextromethorphan (PROMETHAZINE -DM) 6.25-15 MG/5ML syrup Take 5 mLs by mouth 4 (four) times daily as needed for cough. 118 mL 0   No current facility-administered medications on file prior to visit.   "

## 2024-10-15 NOTE — Assessment & Plan Note (Addendum)
 Patient presented to emergency room on March 22, and again December 15, 2022 for left-sided sciatic pain.  Lab obtained during her ED visit, CBC showed hemoglobin 7.6, hematocrit 28.8%, MCV 70.6, platelet 477.  According to epic records, she had normal CBC in 2016, and a mild anemia with hemoglobin 11.6 after cholecystectomy.  MCV was previously normal.  Pt state d that this is her first time being anemic.  10/15/2024 -patient presented with severe microcytic anemia with Hgb 6.5 and HCT 26.1.  Reticulocyte panel was remarkable for increased immature reticulocytes and decreased reticulocyte hemoglobin.  At time of visit, B12 and ferritin levels were still pending.  Due to expected inclement weather, she was advised to have ED visit.  This would be best and fastest way to receive blood transfusion was needed.  Treatment plan for IV Venofer  200 mg sent to [facility] infusion center.  She should receive IV iron  weekly for 5 treatments.  The referral to GI was made for further evaluation of cause of more severe microcytic anemia.

## 2024-10-16 LAB — PREPARE RBC (CROSSMATCH)

## 2024-10-18 LAB — TYPE AND SCREEN
ABO/RH(D): A POS
Antibody Screen: NEGATIVE
Unit division: 0

## 2024-10-18 LAB — BPAM RBC
Blood Product Expiration Date: 202602272359
ISSUE DATE / TIME: 202601302025
Unit Type and Rh: 6200

## 2024-10-22 ENCOUNTER — Ambulatory Visit

## 2024-10-22 VITALS — BP 113/73 | HR 81 | Temp 98.2°F | Resp 16 | Ht 64.0 in | Wt 186.6 lb

## 2024-10-22 DIAGNOSIS — D500 Iron deficiency anemia secondary to blood loss (chronic): Secondary | ICD-10-CM

## 2024-10-22 DIAGNOSIS — D509 Iron deficiency anemia, unspecified: Secondary | ICD-10-CM

## 2024-10-22 MED ORDER — ACETAMINOPHEN 325 MG PO TABS
650.0000 mg | ORAL_TABLET | Freq: Once | ORAL | Status: AC
  Administered 2024-10-22: 650 mg via ORAL
  Filled 2024-10-22: qty 2

## 2024-10-22 MED ORDER — IRON SUCROSE 200 MG IVPB - SIMPLE MED
200.0000 mg | Freq: Once | Status: AC
  Administered 2024-10-22: 200 mg via INTRAVENOUS

## 2024-10-22 MED ORDER — LORATADINE 10 MG PO TABS
10.0000 mg | ORAL_TABLET | Freq: Once | ORAL | Status: AC
  Administered 2024-10-22: 10 mg via ORAL
  Filled 2024-10-22: qty 1

## 2024-10-22 MED ORDER — DIPHENHYDRAMINE HCL 25 MG PO CAPS: 25.0000 mg | ORAL_CAPSULE | Freq: Once | ORAL | Status: DC

## 2024-10-22 NOTE — Progress Notes (Signed)
 Diagnosis: Iron  Deficiency Anemia  Provider:  Praveen Mannam MD  Procedure: IV Infusion  IV Type: Peripheral, IV Location: L Upper Arm   Venofer  (Iron  Sucrose), Dose: 200 mg  Infusion Start Time: 1550  Infusion Stop Time: 1606  Post Infusion IV Care: Observation period completed and Peripheral IV Discontinued  Discharge: Condition: Good, Destination: Home . AVS Declined  Performed by:  Maximiano JONELLE Pouch, LPN

## 2024-10-29 ENCOUNTER — Ambulatory Visit

## 2024-11-05 ENCOUNTER — Ambulatory Visit

## 2024-11-12 ENCOUNTER — Ambulatory Visit

## 2024-11-19 ENCOUNTER — Ambulatory Visit
